# Patient Record
Sex: Male | Born: 1985
Health system: Southern US, Community
[De-identification: ages and names within clinical notes are randomized; demographics above are authoritative.]

## PROBLEM LIST (undated history)

## (undated) DIAGNOSIS — IMO0001 Reserved for inherently not codable concepts without codable children: Secondary | ICD-10-CM

## (undated) DIAGNOSIS — Z9109 Other allergy status, other than to drugs and biological substances: Secondary | ICD-10-CM

## (undated) DIAGNOSIS — K219 Gastro-esophageal reflux disease without esophagitis: Secondary | ICD-10-CM

## (undated) HISTORY — PX: WISDOM TOOTH EXTRACTION: SHX21

## (undated) HISTORY — PX: TONSILLECTOMY: SUR1361

---

## 2013-02-03 ENCOUNTER — Encounter (HOSPITAL_BASED_OUTPATIENT_CLINIC_OR_DEPARTMENT_OTHER): Payer: Self-pay | Admitting: *Deleted

## 2013-02-03 ENCOUNTER — Emergency Department (HOSPITAL_BASED_OUTPATIENT_CLINIC_OR_DEPARTMENT_OTHER)
Admission: EM | Admit: 2013-02-03 | Discharge: 2013-02-03 | Disposition: A | Discharge status: Home / Self Care | Payer: Self-pay | Attending: Emergency Medicine | Admitting: Emergency Medicine

## 2013-02-03 DIAGNOSIS — J029 Acute pharyngitis, unspecified: Secondary | ICD-10-CM | POA: Insufficient documentation

## 2013-02-03 DIAGNOSIS — Z79899 Other long term (current) drug therapy: Secondary | ICD-10-CM | POA: Insufficient documentation

## 2013-02-03 DIAGNOSIS — R51 Headache: Secondary | ICD-10-CM | POA: Insufficient documentation

## 2013-02-03 DIAGNOSIS — Z791 Long term (current) use of non-steroidal anti-inflammatories (NSAID): Secondary | ICD-10-CM | POA: Insufficient documentation

## 2013-02-03 DIAGNOSIS — R1310 Dysphagia, unspecified: Secondary | ICD-10-CM | POA: Insufficient documentation

## 2013-02-03 DIAGNOSIS — K219 Gastro-esophageal reflux disease without esophagitis: Secondary | ICD-10-CM | POA: Insufficient documentation

## 2013-02-03 DIAGNOSIS — R599 Enlarged lymph nodes, unspecified: Secondary | ICD-10-CM | POA: Insufficient documentation

## 2013-02-03 HISTORY — DX: Reserved for inherently not codable concepts without codable children: IMO0001

## 2013-02-03 HISTORY — DX: Gastro-esophageal reflux disease without esophagitis: K21.9

## 2013-02-03 MED ORDER — SALINE SPRAY 0.65 % NA SOLN: 1.0000 | NASAL | Status: DC | PRN

## 2013-02-03 MED ORDER — AMOXICILLIN-POT CLAVULANATE 875-125 MG PO TABS
1.0000 | ORAL_TABLET | Freq: Once | ORAL | Status: AC
  Administered 2013-02-03: 1 via ORAL
  Filled 2013-02-03: qty 1

## 2013-02-03 MED ORDER — DEXAMETHASONE 1 MG/ML PO CONC
10.0000 mg | Freq: Once | ORAL | Status: AC
  Administered 2013-02-03: 10 mg via ORAL
  Filled 2013-02-03: qty 1

## 2013-02-03 MED ORDER — LIDOCAINE VISCOUS 2 % MT SOLN
15.0000 mL | Freq: Once | OROMUCOSAL | Status: AC
  Administered 2013-02-03: 15 mL via OROMUCOSAL
  Filled 2013-02-03: qty 15

## 2013-02-03 MED ORDER — AMOXICILLIN-POT CLAVULANATE 875-125 MG PO TABS: 1.0000 | ORAL_TABLET | Freq: Two times a day (BID) | ORAL | Status: DC

## 2013-02-03 NOTE — ED Notes (Signed)
Patient states he has a five day history of a sore throat.  States he is having problems swallowing, sore throat, headache, generalized body aches and lymph node swelling.  Throat is red with swollen tonsils with white exudate.

## 2013-02-03 NOTE — ED Provider Notes (Signed)
CSN: [id number]     Arrival date & time 02/03/13  4098 History   First MD Initiated Contact with Patient 02/03/13 1023     Chief Complaint  Patient presents with  . Sore Throat   (Consider location/radiation/quality/duration/timing/severity/associated sxs/prior Treatment) HPI Patient presents with concern of sore throat.  Symptoms began approximately 5 days ago.  Since onset he has had sore throat, headache, full sensation in both ears.  There is difficulty swallowing.  There is no dyspnea, chest pain, nausea, vomiting. No behavioral changes, no confusion, no disorientation. Patient states that he has a history of tonsillitis. No relief w otc meds, no clear exacerbating factors.   Past Medical History  Diagnosis Date  . Reflux    History reviewed. No pertinent past surgical history. No family history on file. History  Substance Use Topics  . Smoking status: Never Smoker   . Smokeless tobacco: Not on file  . Alcohol Use: Yes     Comment: occassionally    Review of Systems  All other systems reviewed and are negative.    Allergies  Review of patient's allergies indicates no known allergies.  Home Medications   Current Outpatient Rx  Name  Route  Sig  Dispense  Refill  . calcium carbonate (TUMS - DOSED IN MG ELEMENTAL CALCIUM) 500 MG chewable tablet   Oral   Chew 1 tablet by mouth daily.         . naproxen sodium (ANAPROX) 220 MG tablet   Oral   Take 220 mg by mouth 2 (two) times daily with a meal.          BP 134/66  Pulse 65  Temp(Src) 98.1 F (36.7 C) (Oral)  Resp 20  Ht 6\' 1"  (1.854 m)  Wt 223 lb (101.152 kg)  BMI 29.43 kg/m2  SpO2 100% Physical Exam  Nursing note and vitals reviewed. Constitutional: He is oriented to person, place, and time. He appears well-developed. No distress.  HENT:  Head: Normocephalic and atraumatic.  Each ear has no injection, but there is mild fluid behind the tympanic membranes. The posterior oropharynx is symmetric, and  an edematous uvula.  There is mild bilateral tonsillar enhancement, no gross discharge.  Eyes: Conjunctivae and EOM are normal.  Cardiovascular: Normal rate and regular rhythm.   Pulmonary/Chest: Effort normal. No stridor. No respiratory distress.  Abdominal: He exhibits no distension.  Musculoskeletal: He exhibits no edema.  Lymphadenopathy:    He has cervical adenopathy.       Right cervical: Superficial cervical adenopathy present.       Left cervical: Superficial cervical adenopathy present.  Neurological: He is alert and oriented to person, place, and time.  Skin: Skin is warm and dry.  Psychiatric: He has a normal mood and affect.    ED Course  Procedures (including critical care time) Labs Review Labs Reviewed  RAPID STREP SCREEN   Imaging Review No results found. Pulse oximetry 99% room air normal  MDM  No diagnosis found. Patient with history of tonsillitis presents with new sore throat.  On exam he is awake and alert, with no evidence of distress, or any fever.  Patient's posterior oral pharynx is mildly enhanced, without gross exudate.  However, with his history of tonsillitis, his description of difficulty swallowing, he will be started on antibiotics, pending throat culture.    Gerhard Munch, MD 02/03/13 1105

## 2013-02-06 LAB — CULTURE, GROUP A STREP

## 2013-04-29 ENCOUNTER — Emergency Department (HOSPITAL_BASED_OUTPATIENT_CLINIC_OR_DEPARTMENT_OTHER)
Admission: EM | Admit: 2013-04-29 | Discharge: 2013-04-29 | Disposition: A | Discharge status: Home / Self Care | Payer: Self-pay | Attending: Emergency Medicine | Admitting: Emergency Medicine

## 2013-04-29 ENCOUNTER — Encounter (HOSPITAL_BASED_OUTPATIENT_CLINIC_OR_DEPARTMENT_OTHER): Payer: Self-pay | Admitting: Emergency Medicine

## 2013-04-29 DIAGNOSIS — Z8709 Personal history of other diseases of the respiratory system: Secondary | ICD-10-CM | POA: Insufficient documentation

## 2013-04-29 DIAGNOSIS — H01001 Unspecified blepharitis right upper eyelid: Secondary | ICD-10-CM

## 2013-04-29 DIAGNOSIS — Z791 Long term (current) use of non-steroidal anti-inflammatories (NSAID): Secondary | ICD-10-CM | POA: Insufficient documentation

## 2013-04-29 DIAGNOSIS — K219 Gastro-esophageal reflux disease without esophagitis: Secondary | ICD-10-CM | POA: Insufficient documentation

## 2013-04-29 DIAGNOSIS — H01009 Unspecified blepharitis unspecified eye, unspecified eyelid: Secondary | ICD-10-CM | POA: Insufficient documentation

## 2013-04-29 DIAGNOSIS — Z79899 Other long term (current) drug therapy: Secondary | ICD-10-CM | POA: Insufficient documentation

## 2013-04-29 HISTORY — DX: Other allergy status, other than to drugs and biological substances: Z91.09

## 2013-04-29 MED ORDER — ERYTHROMYCIN 5 MG/GM OP OINT: 1.0000 "application " | TOPICAL_OINTMENT | Freq: Every day | OPHTHALMIC | Status: DC

## 2013-04-29 MED ORDER — TETRACAINE HCL 0.5 % OP SOLN
1.0000 [drp] | Freq: Once | OPHTHALMIC | Status: AC
  Administered 2013-04-29: 1 [drp] via OPHTHALMIC
  Filled 2013-04-29: qty 2

## 2013-04-29 MED ORDER — FLUORESCEIN SODIUM 1 MG OP STRP
2.0000 | ORAL_STRIP | Freq: Once | OPHTHALMIC | Status: DC
  Filled 2013-04-29: qty 2

## 2013-04-29 NOTE — ED Provider Notes (Signed)
I saw and evaluated the patient, reviewed the resident's note and I agree with the findings and plan.   .Face to face Exam:  General:  Awake HEENT:  Atraumatic Resp:  Normal effort Abd:  Nondistended Neuro:No focal weakness Lymph: No adenopathy   Nelia Shi, MD 04/29/13 [phone]

## 2013-04-29 NOTE — ED Provider Notes (Signed)
CSN: [id number]     Arrival date & time 04/29/13  4098 History   First MD Initiated Contact with Patient 04/29/13 [phone]     Chief Complaint  Patient presents with  . Eye Pain   (Consider location/radiation/quality/duration/timing/severity/associated sxs/prior Treatment) Patient is a 27 y.o. male presenting with eye pain.  Eye Pain Pertinent negatives include no abdominal pain, chest pain, congestion, fatigue, fever or sore throat.    Tommy Bradley is a 27 y.o. man who presents with a cc of right sided eyelid pain. 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 He was in his normal state of health until 2 days ago when he noticed right sided eye lid discomfort. This progressed to a swollen right eyelid and increased discomfort.He also notes eyelid discomfort of his left eyelid c.b minimal swelling. He notes a dry itchy sensation of both eyes. He has not had this problem before. He denies diplopia, photophobia, pain with eye movements, and blurry vision. Denies other constitutional symptoms.   Past Medical History  Diagnosis Date  . Reflux   . Pollen allergies    Past Surgical History  Procedure Laterality Date  . Wisdom tooth extraction     No family history on file. History  Substance Use Topics  . Smoking status: Never Smoker   . Smokeless tobacco: Never Used  . Alcohol Use: 0.6 oz/week    1 Cans of beer per week     Comment: occassionally    Review of Systems  Constitutional: Negative for fever and fatigue.  HENT: Negative for congestion, rhinorrhea, sneezing and sore throat.   Eyes: Positive for pain, redness and itching. Negative for photophobia, discharge and visual disturbance.  Respiratory: Negative for shortness of breath.   Cardiovascular: Negative for chest pain and leg swelling.  Gastrointestinal: Negative for abdominal pain and abdominal distention.    Allergies  Review of patient's allergies indicates no known allergies.  Home  Medications   Current Outpatient Rx  Name  Route  Sig  Dispense  Refill  . calcium carbonate (TUMS - DOSED IN MG ELEMENTAL CALCIUM) 500 MG chewable tablet   Oral   Chew 1 tablet by mouth daily.         . naproxen sodium (ANAPROX) 220 MG tablet   Oral   Take 220 mg by mouth 2 (two) times daily with a meal.         . amoxicillin-clavulanate (AUGMENTIN) 875-125 MG per tablet   Oral   Take 1 tablet by mouth every 12 (twelve) hours.   14 tablet   0   . sodium chloride (OCEAN) 0.65 % SOLN nasal spray   Nasal   Place 1 spray into the nose as needed (sinus congestion and ear fullness).   1 Bottle   0    BP 122/67  Pulse 54  Temp(Src) 98 F (36.7 C) (Oral)  Ht 6\' 1"  (1.854 m)  Wt 225 lb (102.059 kg)  BMI 29.69 kg/m2  SpO2 100% Physical Exam  Constitutional: He appears well-developed and well-nourished. No distress.  HENT:  Head: Normocephalic.  Mouth/Throat: Oropharynx is clear and moist. No oropharyngeal exudate.  Eyes: EOM are normal. Pupils are equal, round, and reactive to light. Right eye exhibits no discharge. Left eye exhibits no discharge. No scleral icterus.  Slit lamp exam:      The right eye shows no corneal abrasion, no corneal flare, no corneal ulcer, no foreign body, no hyphema, no hypopyon and  no fluorescein uptake.       The left eye shows no corneal abrasion, no corneal flare, no corneal ulcer, no foreign body, no hyphema, no hypopyon and no fluorescein uptake.    Edema of the right upper eyelid with a small area of focused swelling nasally. Three small (<1 mm) hazy opacities temporally in the right cornea. One small area of the left cornea with hazy opacity temporally. No fluorescein uptake in either cornea. Pressure 14 OD and 13 OS. VA 20/30 bil with spectacles. Moderate erythema of the right conjunctiva and mild erythema of the left conjunctiva. Tear film decreased bilaterally.  Skin: He is not diaphoretic.    ED Course  Procedures (including critical  care time) Labs Review Labs Reviewed - No data to display Imaging Review No results found.  EKG Interpretation   None       MDM   1. Blepharitis of right upper eyelid     1. Blepharitis The patients eye discomfort is likely secondary to blepharitis. Orbital cellulitis is unlikely given normal pressure normal visual fields and normal EOM. I recommended the patient to avoid CTLs until see eye doctor. I recommended the patient to contact an ophthalmolost on Monday for an appointment early in the week. I also recommended the patient to perform eye hygiene with baby shampoo on cotton swabs and erythromycin ointment. The patient is in agreement with this plan.     Pleas Koch, MD 04/29/13 [phone]

## 2013-04-29 NOTE — ED Notes (Signed)
C/o irritation to rim of right eye lid since Thursday- redness noted- states now left eye hurting also

## 2013-07-12 ENCOUNTER — Emergency Department (HOSPITAL_BASED_OUTPATIENT_CLINIC_OR_DEPARTMENT_OTHER)
Admission: EM | Admit: 2013-07-12 | Discharge: 2013-07-12 | Disposition: A | Discharge status: Home / Self Care | Payer: BC Managed Care – PPO | Attending: Emergency Medicine | Admitting: Emergency Medicine

## 2013-07-12 ENCOUNTER — Encounter (HOSPITAL_BASED_OUTPATIENT_CLINIC_OR_DEPARTMENT_OTHER): Payer: Self-pay | Admitting: Emergency Medicine

## 2013-07-12 ENCOUNTER — Emergency Department (HOSPITAL_BASED_OUTPATIENT_CLINIC_OR_DEPARTMENT_OTHER): Payer: BC Managed Care – PPO

## 2013-07-12 DIAGNOSIS — R630 Anorexia: Secondary | ICD-10-CM | POA: Insufficient documentation

## 2013-07-12 DIAGNOSIS — R51 Headache: Secondary | ICD-10-CM | POA: Insufficient documentation

## 2013-07-12 DIAGNOSIS — K219 Gastro-esophageal reflux disease without esophagitis: Secondary | ICD-10-CM | POA: Insufficient documentation

## 2013-07-12 DIAGNOSIS — Z791 Long term (current) use of non-steroidal anti-inflammatories (NSAID): Secondary | ICD-10-CM | POA: Insufficient documentation

## 2013-07-12 DIAGNOSIS — R509 Fever, unspecified: Secondary | ICD-10-CM | POA: Insufficient documentation

## 2013-07-12 DIAGNOSIS — Z792 Long term (current) use of antibiotics: Secondary | ICD-10-CM | POA: Insufficient documentation

## 2013-07-12 DIAGNOSIS — R5381 Other malaise: Secondary | ICD-10-CM | POA: Insufficient documentation

## 2013-07-12 DIAGNOSIS — R5383 Other fatigue: Secondary | ICD-10-CM

## 2013-07-12 DIAGNOSIS — R197 Diarrhea, unspecified: Secondary | ICD-10-CM | POA: Insufficient documentation

## 2013-07-12 DIAGNOSIS — R112 Nausea with vomiting, unspecified: Secondary | ICD-10-CM | POA: Insufficient documentation

## 2013-07-12 DIAGNOSIS — Z79899 Other long term (current) drug therapy: Secondary | ICD-10-CM | POA: Insufficient documentation

## 2013-07-12 DIAGNOSIS — R109 Unspecified abdominal pain: Secondary | ICD-10-CM | POA: Insufficient documentation

## 2013-07-12 LAB — COMPREHENSIVE METABOLIC PANEL
ALBUMIN: 3.8 g/dL (ref 3.5–5.2)
ALK PHOS: 77 U/L (ref 39–117)
ALT: 15 U/L (ref 0–53)
AST: 18 U/L (ref 0–37)
BILIRUBIN TOTAL: 0.6 mg/dL (ref 0.3–1.2)
BUN: 11 mg/dL (ref 6–23)
CHLORIDE: 99 meq/L (ref 96–112)
CO2: 28 mEq/L (ref 19–32)
CREATININE: 1.2 mg/dL (ref 0.50–1.35)
Calcium: 8.7 mg/dL (ref 8.4–10.5)
GFR calc Af Amer: >90 mL/min (ref >90)
GFR calc non Af Amer: 83 mL/min — ABNORMAL LOW (ref >90)
Glucose, Bld: 112 mg/dL — ABNORMAL HIGH (ref 70–99)
POTASSIUM: 3.3 meq/L — AB (ref 3.7–5.3)
SODIUM: 139 meq/L (ref 137–147)
Total Protein: 7.4 g/dL (ref 6.0–8.3)

## 2013-07-12 LAB — CBC WITH DIFFERENTIAL/PLATELET
BASOS ABS: 0 10*3/uL (ref 0.0–0.1)
BASOS PCT: 0 % (ref 0–1)
Eosinophils Absolute: 0 10*3/uL (ref 0.0–0.7)
Eosinophils Relative: 0 % (ref 0–5)
HCT: 45.4 % (ref 39.0–52.0)
Hemoglobin: 15.8 g/dL (ref 13.0–17.0)
Lymphocytes Relative: 13 % (ref 12–46)
Lymphs Abs: 1 10*3/uL (ref 0.7–4.0)
MCH: 29.2 pg (ref 26.0–34.0)
MCHC: 34.8 g/dL (ref 30.0–36.0)
MCV: 83.9 fL (ref 78.0–100.0)
Monocytes Absolute: 0.7 10*3/uL (ref 0.1–1.0)
Monocytes Relative: 9 % (ref 3–12)
NEUTROS ABS: 5.9 10*3/uL (ref 1.7–7.7)
NEUTROS PCT: 77 % (ref 43–77)
PLATELETS: 190 10*3/uL (ref 150–400)
RBC: 5.41 MIL/uL (ref 4.22–5.81)
RDW: 12.1 % (ref 11.5–15.5)
WBC: 7.6 10*3/uL (ref 4.0–10.5)

## 2013-07-12 LAB — URINALYSIS, ROUTINE W REFLEX MICROSCOPIC
Bilirubin Urine: NEGATIVE
GLUCOSE, UA: NEGATIVE mg/dL
Ketones, ur: NEGATIVE mg/dL
LEUKOCYTES UA: NEGATIVE
NITRITE: NEGATIVE
PH: 6 (ref 5.0–8.0)
Protein, ur: NEGATIVE mg/dL
SPECIFIC GRAVITY, URINE: 1.016 (ref 1.005–1.030)
Urobilinogen, UA: 0.2 mg/dL (ref 0.0–1.0)

## 2013-07-12 LAB — URINE MICROSCOPIC-ADD ON

## 2013-07-12 LAB — LIPASE, BLOOD: Lipase: 26 U/L (ref 11–59)

## 2013-07-12 MED ORDER — IOHEXOL 300 MG/ML  SOLN
100.0000 mL | Freq: Once | INTRAMUSCULAR | Status: AC | PRN
  Administered 2013-07-12: 100 mL via INTRAVENOUS

## 2013-07-12 MED ORDER — IOHEXOL 300 MG/ML  SOLN
50.0000 mL | Freq: Once | INTRAMUSCULAR | Status: AC | PRN
  Administered 2013-07-12: 50 mL via ORAL

## 2013-07-12 MED ORDER — ONDANSETRON HCL 4 MG/2ML IJ SOLN
4.0000 mg | Freq: Once | INTRAMUSCULAR | Status: AC
  Administered 2013-07-12: 4 mg via INTRAVENOUS
  Filled 2013-07-12: qty 2

## 2013-07-12 MED ORDER — ONDANSETRON HCL 4 MG PO TABS: 4.0000 mg | ORAL_TABLET | Freq: Four times a day (QID) | ORAL | Status: DC

## 2013-07-12 MED ORDER — MORPHINE SULFATE 4 MG/ML IJ SOLN
4.0000 mg | Freq: Once | INTRAMUSCULAR | Status: AC
Start: 2013-07-12 — End: 2013-07-12
  Administered 2013-07-12: 4 mg via INTRAVENOUS
  Filled 2013-07-12: qty 1

## 2013-07-12 MED ORDER — ACETAMINOPHEN 325 MG PO TABS
650.0000 mg | ORAL_TABLET | Freq: Once | ORAL | Status: AC
  Administered 2013-07-12: 650 mg via ORAL
  Filled 2013-07-12: qty 2

## 2013-07-12 MED ORDER — SODIUM CHLORIDE 0.9 % IV BOLUS (SEPSIS)
1000.0000 mL | Freq: Once | INTRAVENOUS | Status: AC
  Administered 2013-07-12: 1000 mL via INTRAVENOUS

## 2013-07-12 NOTE — Discharge Instructions (Signed)
Take Zofran as needed for nausea and vomiting  Drink plenty of fluids and rest  Eat a clear liquid diet for 24 hours  Take 600-800 mg Ibuprofen every 6-8 hours for fever/pain - you may alternate with 650 mg Tylenol every 4-6 hours  Return to the emergency department if you develop any changing/worsening condition, repeated vomiting, fever not reducing, blood in your stool/vomit, stiff neck, feeling like you are going to pass out, or any other concerns (please read additional information regarding your condition below)   Abdominal Pain, Adult Many things can cause abdominal pain. Usually, abdominal pain is not caused by a disease and will improve without treatment. It can often be observed and treated at home. Your health care provider will do a physical exam and possibly order blood tests and X-rays to help determine the seriousness of your pain. However, in many cases, more time must pass before a clear cause of the pain can be found. Before that point, your health care provider may not know if you need more testing or further treatment. HOME CARE INSTRUCTIONS  Monitor your abdominal pain for any changes. The following actions may help to alleviate any discomfort you are experiencing:  Only take over-the-counter or prescription medicines as directed by your health care provider.  Do not take laxatives unless directed to do so by your health care provider.  Try a clear liquid diet (broth, tea, or water) as directed by your health care provider. Slowly move to a bland diet as tolerated. SEEK MEDICAL CARE IF:  You have unexplained abdominal pain.  You have abdominal pain associated with nausea or diarrhea.  You have pain when you urinate or have a bowel movement.  You experience abdominal pain that wakes you in the night.  You have abdominal pain that is worsened or improved by eating food.  You have abdominal pain that is worsened with eating fatty foods. SEEK IMMEDIATE MEDICAL CARE IF:     Your pain does not go away within 2 hours.  You have a fever.  You keep throwing up (vomiting).  Your pain is felt only in portions of the abdomen, such as the right side or the left lower portion of the abdomen.  You pass bloody or black tarry stools. MAKE SURE YOU:  Understand these instructions.   Will watch your condition.   Will get help right away if you are not doing well or get worse.  Document Released: 02/25/2005 Document Revised: 03/08/2013 Document Reviewed: 01/25/2013 [facility] Patient Information 2014 [place].  Diarrhea Diarrhea is frequent loose and watery bowel movements. It can cause you to feel weak and dehydrated. Dehydration can cause you to become tired and thirsty, have a dry mouth, and have decreased urination that often is dark yellow. Diarrhea is a sign of another problem, most often an infection that will not last long. In most cases, diarrhea typically lasts 2 3 days. However, it can last longer if it is a sign of something more serious. It is important to treat your diarrhea as directed by your caregive to lessen or prevent future episodes of diarrhea. CAUSES  Some common causes include:  Gastrointestinal infections caused by viruses, bacteria, or parasites.  Food poisoning or food allergies.  Certain medicines, such as antibiotics, chemotherapy, and laxatives.  Artificial sweeteners and fructose.  Digestive disorders. HOME CARE INSTRUCTIONS  Ensure adequate fluid intake (hydration): have 1 cup (8 oz) of fluid for each diarrhea episode. Avoid fluids that contain simple sugars or sports drinks,  fruit juices, whole milk products, and sodas. Your urine should be clear or pale yellow if you are drinking enough fluids. Hydrate with an oral rehydration solution that you can purchase at pharmacies, retail stores, and online. You can prepare an oral rehydration solution at home by mixing the following ingredients together:    tsp table salt.    tsp baking soda.   tsp salt substitute containing potassium chloride.  1  tablespoons sugar.  1 L (34 oz) of water.  Certain foods and beverages may increase the speed at which food moves through the gastrointestinal (GI) tract. These foods and beverages should be avoided and include:  Caffeinated and alcoholic beverages.  High-fiber foods, such as raw fruits and vegetables, nuts, seeds, and whole grain breads and cereals.  Foods and beverages sweetened with sugar alcohols, such as xylitol, sorbitol, and mannitol.  Some foods may be well tolerated and may help thicken stool including:  Starchy foods, such as rice, toast, pasta, low-sugar cereal, oatmeal, grits, baked potatoes, crackers, and bagels.  Bananas.  Applesauce.  Add probiotic-rich foods to help increase healthy bacteria in the GI tract, such as yogurt and fermented milk products.  Wash your hands well after each diarrhea episode.  Only take over-the-counter or prescription medicines as directed by your caregiver.  Take a warm bath to relieve any burning or pain from frequent diarrhea episodes. SEEK IMMEDIATE MEDICAL CARE IF:   You are unable to keep fluids down.  You have persistent vomiting.  You have blood in your stool, or your stools are black and tarry.  You do not urinate in 6 8 hours, or there is only a small amount of very dark urine.  You have abdominal pain that increases or localizes.  You have weakness, dizziness, confusion, or lightheadedness.  You have a severe headache.  Your diarrhea gets worse or does not get better.  You have a fever or persistent symptoms for more than 2 3 days.  You have a fever and your symptoms suddenly get worse. MAKE SURE YOU:   Understand these instructions.  Will watch your condition.  Will get help right away if you are not doing well or get worse. Document Released: 05/08/2002 Document Revised: 05/04/2012 Document Reviewed: 01/24/2012 [facility] Patient  Information 2014 [place], [place].  Fever, Adult A fever is a higher than normal body temperature. In an adult, an oral temperature around 98.6 F (37 C) is considered normal. A temperature of 100.4 F (38 C) or higher is generally considered a fever. Mild or moderate fevers generally have no long-term effects and often do not require treatment. Extreme fever (greater than or equal to 106 F or 41.1 C) can cause seizures. The sweating that may occur with repeated or prolonged fever may cause dehydration. Elderly people can develop confusion during a fever. A measured temperature can vary with:  Age.  Time of day.  Method of measurement (mouth, underarm, rectal, or ear). The fever is confirmed by taking a temperature with a thermometer. Temperatures can be taken different ways. Some methods are accurate and some are not.  An oral temperature is used most commonly. Electronic thermometers are fast and accurate.  An ear temperature will only be accurate if the thermometer is positioned as recommended by the manufacturer.  A rectal temperature is accurate and done for those adults who have a condition where an oral temperature cannot be taken.  An underarm (axillary) temperature is not accurate and not recommended. Fever is a symptom, not a  disease.  CAUSES   Infections commonly cause fever.  Some noninfectious causes for fever include:  Some arthritis conditions.  Some thyroid or adrenal gland conditions.  Some immune system conditions.  Some types of cancer.  A medicine reaction.  High doses of certain street drugs such as methamphetamine.  Dehydration.  Exposure to high outside or room temperatures.  Occasionally, the source of a fever cannot be determined. This is sometimes called a "fever of unknown origin" (FUO).  Some situations may lead to a temporary rise in body temperature that may go away on its own. Examples are:  Childbirth.  Surgery.  Intense  exercise. HOME CARE INSTRUCTIONS   Take appropriate medicines for fever. Follow dosing instructions carefully. If you use acetaminophen to reduce the fever, be careful to avoid taking other medicines that also contain acetaminophen. Do not take aspirin for a fever if you are younger than age 69. There is an association with Reye's syndrome. Reye's syndrome is a rare but potentially deadly disease.  If an infection is present and antibiotics have been prescribed, take them as directed. Finish them even if you start to feel better.  Rest as needed.  Maintain an adequate fluid intake. To prevent dehydration during an illness with prolonged or recurrent fever, you may need to drink extra fluid.Drink enough fluids to keep your urine clear or pale yellow.  Sponging or bathing with room temperature water may help reduce body temperature. Do not use ice water or alcohol sponge baths.  Dress comfortably, but do not over-bundle. SEEK MEDICAL CARE IF:   You are unable to keep fluids down.  You develop vomiting or diarrhea.  You are not feeling at least partly better after 3 days.  You develop new symptoms or problems. SEEK IMMEDIATE MEDICAL CARE IF:   You have shortness of breath or trouble breathing.  You develop excessive weakness.  You are dizzy or you faint.  You are extremely thirsty or you are making little or no urine.  You develop new pain that was not there before (such as in the head, neck, chest, back, or abdomen).  You have persistant vomiting and diarrhea for more than 1 to 2 days.  You develop a stiff neck or your eyes become sensitive to light.  You develop a skin rash.  You have a fever or persistent symptoms for more than 2 to 3 days.  You have a fever and your symptoms suddenly get worse. MAKE SURE YOU:   Understand these instructions.  Will watch your condition.  Will get help right away if you are not doing well or get worse. Document Released: 11/11/2000  Document Revised: 08/10/2011 Document Reviewed: 03/19/2011 [facility] Patient Information 2014 [place], [place].  Diet The clear liquid diet consists of foods that are liquid or will become liquid at room temperature. Examples of foods allowed on a clear liquid diet include fruit juice, broth or bouillon, gelatin, or frozen ice pops. You should be able to see through the liquid. The purpose of this diet is to provide the necessary fluids, electrolytes (such as sodium and potassium), and energy to keep the body functioning during times when you are not able to consume a regular diet. A clear liquid diet should not be continued for long periods of time, as it is not nutritionally adequate.  A CLEAR LIQUID DIET MAY BE NEEDED:  When a sudden-onset (acute) condition occurs before or after surgery.   As the first step in oral feeding.   For fluid  and electrolyte replacement in diarrheal diseases.   As a diet before certain medical tests are performed.  ADEQUACY The clear liquid diet is adequate only in ascorbic acid, according to the Recommended Dietary Allowances of the [employer].  CHOOSING FOODS Breads and Starches  Allowed: None are allowed.   Avoid: All are to be avoided.  Vegetables  Allowed: Strained vegetable juices.   Avoid: Any others.  Fruit  Allowed: Strained fruit juices and fruit drinks. Include 1 serving of citrus or vitamin C-enriched fruit juice daily.   Avoid: Any others.  Meat and Meat Substitutes  Allowed: None are allowed.   Avoid: All are to be avoided.  Milk Products  Allowed: None are allowed.   Avoid: All are to be avoided.  Soups and Combination Foods  Allowed: Clear bouillon, broth, or strained broth-based soups.   Avoid: Any others.  Desserts and Sweets  Allowed: Sugar, honey. High-protein gelatin. Flavored gelatin, ices, or frozen ice pops that do not contain milk.   Avoid: Any others.   Fats and Oils  Allowed: None are allowed.   Avoid: All are to be avoided.  Beverages  Allowed: Cereal beverages, coffee (regular or decaffeinated), tea, or soda at the discretion of your health care provider.   Avoid: Any others.  Condiments  Allowed: Salt.   Avoid: Any others, including pepper.  Supplements  Allowed: Liquid nutrition beverages that you can see through.   Avoid: Any others that contain lactose or fiber. SAMPLE MEAL PLAN Breakfast  4 oz (120 mL) strained orange juice.   to 1 cup (120 to 240 mL) gelatin (plain or fortified).  1 cup (240 mL) beverage (coffee or tea).  Sugar, if desired. Midmorning Snack   cup (120 mL) gelatin (plain or fortified). Lunch  1 cup (240 mL) broth or consomm.  4 oz (120 mL) strained grapefruit juice.   cup (120 mL) gelatin (plain or fortified).  1 cup (240 mL) beverage (coffee or tea).  Sugar, if desired. Midafternoon Snack   cup (120 mL) fruit ice.   cup (120 mL) strained fruit juice. Dinner  1 cup (240 mL) broth or consomm.   cup (120 mL) cranberry juice.   cup (120 mL) flavored gelatin (plain or fortified).  1 cup (240 mL) beverage (coffee or tea).  Sugar, if desired. Evening Snack  4 oz (120 mL) strained apple juice (vitamin C-fortified).   cup (120 mL) flavored gelatin (plain or fortified). MAKE SURE YOU:  Understand these instructions.  Will watch your child's condition.  Will get help right away if your child is not doing well or gets worse. Document Released: 05/18/2005 Document Revised: 01/18/2013 Document Reviewed: 10/18/2012 [facility] Patient Information 2014 [place], [place].  Emergency Department Resource Guide 1) Find a Doctor and Pay Out of Pocket Although you won't have to find out who is covered by your insurance plan, it is a good idea to ask around and get recommendations. You will then need to call the office and see if the doctor you have chosen will  accept you as a new patient and what types of options they offer for patients who are self-pay. Some doctors offer discounts or will set up payment plans for their patients who do not have insurance, but you will need to ask so you aren't surprised when you get to your appointment.  2) Contact Your Local Health Department Not all health departments have doctors that can see patients for sick visits, but many do, so it is  worth a call to see if yours does. If you don't know where your local health department is, you can check in your phone book. The CDC also has a tool to help you locate your state's health department, and many state websites also have listings of all of their local health departments.  3) Find a Walk-in Clinic If your illness is not likely to be very severe or complicated, you may want to try a walk in clinic. These are popping up all over the country in pharmacies, drugstores, and shopping centers. They're usually staffed by nurse practitioners or physician assistants that have been trained to treat common illnesses and complaints. They're usually fairly quick and inexpensive. However, if you have serious medical issues or chronic medical problems, these are probably not your best option.  No Primary Care Doctor: - Call Health Connect at  [phone] - they can help you locate a primary care doctor that  accepts your insurance, provides certain services, etc. - Physician Referral Service- [phone]  Chronic Pain Problems: Organization         Address  Phone   Notes  [facility]  [phone] Patients need to be referred by their primary care doctor.   Medication Assistance: Organization         Address  Phone   Notes  [facility] Medication [facility] [address] [place] [place], [place] [address] [phone] --Must be a resident of [facility] -- Must have NO insurance coverage whatsoever (no Medicaid/ Medicare, etc.) -- The pt.  MUST have a primary care doctor that directs their care regularly and follows them in the community   [facility]  [phone]   [employer]  [phone]    Agencies that provide inexpensive medical care: Organization         Address  Phone   Notes  [facility]  [phone]   [facility]    [phone]   [facility] [address] [place], [place] [address] [phone]   [facility] [address] [place], [place] [phone]   [facility]anned Parenthood    [phone]   [facility]    [phone]   Community Health and [facility]  [address], [place] Phone:  [phone], Fax:  [phone] Hours of Operation:  9 am - 6 pm, M-F.  Also accepts Medicaid/Medicare and self-pay.  [facility]  [address], [place] Phone: [phone], Fax: [phone]. Hours of Operation:  8:30 am - 5:30 pm, M-F.  Also accepts Medicaid and self-pay.  [facility] [place] [address] [place] Phone: [phone]   [facility] [address] Natasha Bence [place], [place] [phone], Ext. [phone] Mondays & Thursdays: 7-9 AM.  First 15 patients are seen on a first come, first serve basis.    Medicaid-accepting [facility] Providers:  Organization         Address  Phone   Notes  [facility] [address],  [phone] Also accepts self-pay patients.  [facility] [address] Laurell Josephs [place], [place]  [phone]   [facility] [address], [place] [phone]   [facility] [address], [place] [phone]   Renaye Rakers [address] [place], [place]  7, [place]   [phone] Only accepts [place] patients after they have their name applied to their card.   Self-Pay (no insurance) in  [facility]:  Organization         Address  Phone   Notes  Sickle Cell Patients, [facility] [address] [place], [place] [phone]   [facility] [place] [facility] [address] [place], [place] [phone]   [facility]  [address] [place] [address], [place] [phone]   [facility]/Dr. Osei-Bonsu  [address] [place] or [address], [place] [phone] Phone number for both [place] and [place] locations is the same.  Urgent Medical and [facility] [address], [place] [phone]   [facility] [address] [place] or [address] [phone]   [facility] [address], [place] [phone], phone; [phone], fax Sees patients 1st and 3rd Saturday of every month.  Must not qualify for public or private insurance (i.e. Medicaid, Medicare, [place] Health Choice, Veterans' Benefits)  Household income should be no more than 200% of the poverty level The clinic cannot treat you if you are pregnant or think you are pregnant  Sexually transmitted diseases are not treated at the clinic.    Dental Care: Organization         Address  Phone  Notes  [facility] Department of [facility] [address] [place], [place] [phone] Accepts children up to age 56 who are enrolled in [place] or [place] Health Choice; pregnant women with a Medicaid card; and children who have applied for Medicaid or [place] Health Choice, but were declined, whose parents can pay a reduced fee at time of service.  [facility] Department of [facility]  [address], [place] [phone] Accepts children up to age 56 who are enrolled in [place] or [place] Health Choice; pregnant women with a Medicaid card; and children who have applied for Medicaid or [place] Health Choice, but were declined, whose  parents can pay a reduced fee at time of service.  [facility]  [address] [place], [place] [phone] Patients are seen by appointment only. Walk-ins are not accepted. [facility] will see patients 75 years of age and older. Monday - Tuesday (8am-5pm) Most Wednesdays (8:30-5pm) $30 per visit, cash only  [facility] Adult Dental Access PROGRAM  [address], [facility] [phone] Patients are seen by appointment only. Walk-ins are not accepted. [facility] will see patients 20 years of age and older. One Wednesday Evening (Monthly: Volunteer Based).  $30 per visit, cash only  [employer]  [phone] for adults; Children under age 39, call Graduate Pediatric Dentistry at [phone]. Children aged 85-14, please call [phone] to request a pediatric application.  Dental services are provided in all areas of dental care including fillings, crowns and bridges, complete and partial dentures, implants, gum treatment, root canals, and extractions. Preventive care is also provided. Treatment is provided to both adults and children. Patients are selected via a lottery and there is often a waiting list.   [facility] [address] [place]  [phone] [email or web address]   [facility] [address] [place], [place] [phone], Ext. [phone] Second and Fourth Thursday of each  month, opens at 6:30 AM; Clinic ends at 9 AM.  Patients are seen on a first-come first-served basis, and a limited number are seen during each clinic.   [facility]  [address] Ether Griffins [place], [place] [phone]   Eligibility Requirements You must have lived in [place], [place], or [place] counties for at least the last three months.   You cannot be eligible for state or federal sponsored [place], including [employer], [place], or [employer].   You generally cannot be eligible for  healthcare insurance through your employer.    How to apply: Eligibility screenings are held every Tuesday and Wednesday afternoon from 1:00 pm until 4:00 pm. You do not need an appointment for the interview!  [facility] [address] [place], [place] [phone]   [facility] Health Department  [phone]   [facility] Health Department  [phone]   [facility]  [phone]    Behavioral Health Resources in the Community: Intensive Outpatient Programs Organization         Address  Phone  Notes  [facility] Services [address] [place], [place] [phone]   [facility] Outpatient [address], [place], [place] [phone]   [facility] [address] [place], [place]  [phone]   [facility] [address],  [place], [place] [phone] or [phone]   Substance Abuse Resources Organization         Address  Phone  Notes  [facility] Drug Services  [phone]   [facility]  [phone]   [facility]  [phone]   Floydene Flock  [phone]   Residential & Outpatient Substance Abuse Program  [phone]   Psychological Services Organization         Address  Phone  Notes  [facility] Behavioral Health  [phone]   [facility] Services  [phone]   [facility] [address] [place] [phone] or [phone]    Mobile Crisis Teams Organization         Address  Phone  Notes  [facility], Mobile Crisis Care Unit  [phone]   [facility]  [address] [place], [place] [phone]   Doristine Locks [address] [place] [phone]    Self-Help/Support Groups Organization         Address  Phone             Notes  [facility] [place] - variety of support groups  [phone]- [phone] Call for more information  Narcotics  Anonymous (NA), [facility] [address], [employer] [place]  2 meetings at this location   Statistician         Address  Phone  Notes  [facility] [address],    [place]  [phone]   [facility]  [address] [place] [address], [place], [place] [phone]   [facility] [address] [place], [place] [phone] Admissions: 8am-3pm M-F  [facility] [address],    [place], [place] [phone]   [facility] [address] [place] Manns [place], [place] [phone]   [facility] [address]  [place], [place] [phone]   [facility] Intensive Outpatient [address], [place], [place] [phone]   [facility] ([facility] [address]  [place],  [place] [phone] or [phone]   [facility] [address] [place], [place] [phone] Accepts Medicaid  [facility] [address]  [place] [phone] Substance Abuse/Addiction Treatment   [facility]         Address  Phone  Notes  [facility]  [phone]   Angie Fava, PhD [address] Ervin Knack [place], [place]   [phone] or [phone]   [facility]   [address] [place], [place] [phone]   [facility] [address] [place], [place] [phone] Insurance/Medicaid/sponsorship through [facility] [address]                                    [place], [place] [phone] Therapy/tele-psych/case  [facility] [address], [place] [phone]    Dr. Lolly Mustache  [phone]   [facility]  [facility] Dept[facility] 1) [address], [place] 2) [address] [place] 3)  [address], [place] [phone]  [phone]   [facility] Child Abuse Hotline [phone] or [phone] (After Hours)

## 2013-07-12 NOTE — ED Notes (Signed)
Urinal provided.

## 2013-07-12 NOTE — ED Notes (Signed)
C/o n/v/d, chills x 2 days-also c/o HA-steady gait into triage

## 2013-07-13 NOTE — ED Provider Notes (Signed)
CSN: [id number]     Arrival date & time 07/12/13  1834 History   First MD Initiated Contact with Patient 07/12/13 1910     Chief Complaint  Patient presents with  . Emesis    HPI  Tommy Bradley is a 28 y.o. male with a PMH of reflux and pollen allergies who presents to the ED for evaluation of emesis.  History was provided by the patient. Patient states that he developed gradually worsening abdominal pain over the past 2 days. His pain is located diffusely throughout his abdomen. Movement and walking makes his pain worse. Nothing relieves his pain. He describes his pain as a constant stabbing "hunger" pain. He tried OTC anti-nausea and anti-gas medication with no relief. No similar abdominal pain in the past. No previous abdominal surgeries. He also has had nausea, vomiting, and diarrhea, which started today. He had 2-3 episodes of emesis and 6-7 episodes of watery-brown today. No hematemesis or hematochezia. No known sick contacts or contacts with similar symptoms. He also developed a subjective fever and chills today. He also complains of a bilateral temporal headache today. No vision changes, weakness, stiff neck. No coughing, chest pain, SOB, rhinorrhea, congestion, sore throat, leg edema, dizziness, or lightheadedness. He also has had intermittent left scrotal pressure with urination but denies any testicular pain. He also has had a foul odor in his urine. No difficulty with urination, penile discharge/pain.    Past Medical History  Diagnosis Date  . Reflux   . Pollen allergies    Past Surgical History  Procedure Laterality Date  . Wisdom tooth extraction     No family history on file. History  Substance Use Topics  . Smoking status: Never Smoker   . Smokeless tobacco: Never Used  . Alcohol Use: 0.0 oz/week     Comment: occassionally    Review of Systems  Constitutional: Positive for fever, chills, appetite change and fatigue. Negative for diaphoresis and activity change.  HENT:  Negative for congestion, rhinorrhea and sore throat.   Eyes: Negative for photophobia and visual disturbance.  Respiratory: Negative for cough and shortness of breath.   Cardiovascular: Negative for chest pain and leg swelling.  Gastrointestinal: Positive for nausea, vomiting, abdominal pain and diarrhea. Negative for constipation, blood in stool, anal bleeding and rectal pain.  Genitourinary: Negative for dysuria, urgency, hematuria, discharge, penile swelling, scrotal swelling, difficulty urinating, penile pain and testicular pain.  Neurological: Positive for headaches. Negative for dizziness, syncope, weakness, light-headedness and numbness.    Allergies  Review of patient's allergies indicates no known allergies.  Home Medications   Current Outpatient Rx  Name  Route  Sig  Dispense  Refill  . amoxicillin-clavulanate (AUGMENTIN) 875-125 MG per tablet   Oral   Take 1 tablet by mouth every 12 (twelve) hours.   14 tablet   0   . calcium carbonate (TUMS - DOSED IN MG ELEMENTAL CALCIUM) 500 MG chewable tablet   Oral   Chew 1 tablet by mouth daily.         Marland Kitchen erythromycin ophthalmic ointment   Both Eyes   Place 1 application into both eyes at bedtime.   3.5 g   0   . naproxen sodium (ANAPROX) 220 MG tablet   Oral   Take 220 mg by mouth 2 (two) times daily with a meal.         . ondansetron (ZOFRAN) 4 MG tablet   Oral   Take 1 tablet (4 mg total) by mouth every  6 (six) hours.   8 tablet   0   . sodium chloride (OCEAN) 0.65 % SOLN nasal spray   Nasal   Place 1 spray into the nose as needed (sinus congestion and ear fullness).   1 Bottle   0    BP 113/58  Pulse 69  Temp(Src) 99 F (37.2 C) (Oral)  Resp 18  Ht 6\' 1"  (1.854 m)  Wt 215 lb (97.523 kg)  BMI 28.37 kg/m2  SpO2 99%  Filed Vitals:   07/12/13 1841 07/12/13 1925 07/12/13 2048  BP: 96/60 135/58 113/58  Pulse: 84 72 69  Temp: 100.3 F (37.9 C) 102.8 F (39.3 C) 99 F (37.2 C)  TempSrc: Oral Oral    Resp: 16 18   Height: 6\' 1"  (1.854 m)    Weight: 215 lb (97.523 kg)    SpO2: 100% 99%     Physical Exam  Nursing note and vitals reviewed. Constitutional: He is oriented to person, place, and time. He appears well-developed and well-nourished. No distress.  HENT:  Head: Normocephalic and atraumatic.  Right Ear: External ear normal.  Left Ear: External ear normal.  Nose: Nose normal.  Mouth/Throat: Oropharynx is clear and moist. No oropharyngeal exudate.  Eyes: Conjunctivae are normal. Right eye exhibits no discharge. Left eye exhibits no discharge.  Neck: Normal range of motion. Neck supple.  Cardiovascular: Normal rate, regular rhythm, normal heart sounds and intact distal pulses.  Exam reveals no gallop and no friction rub.   No murmur heard. Pulmonary/Chest: Effort normal and breath sounds normal. No respiratory distress. He has no wheezes. He has no rales. He exhibits no tenderness.  Abdominal: Soft. Bowel sounds are normal. He exhibits no distension and no mass. There is tenderness. There is no rebound and no guarding.  Diffuse tenderness to palpation to the abdomen throughout  Genitourinary:  No testicular tenderness bilaterally. No genital sores. No edema to the scrotum.   Musculoskeletal: Normal range of motion. He exhibits no edema and no tenderness.  No lumbar, CVA, or flank tenderness bilaterally  Neurological: He is alert and oriented to person, place, and time.  Skin: Skin is warm and dry. He is not diaphoretic.    ED Course  Procedures (including critical care time) Labs Review Labs Reviewed  URINALYSIS, ROUTINE W REFLEX MICROSCOPIC - Abnormal; Notable for the following:    Hgb urine dipstick SMALL (*)    All other components within normal limits  COMPREHENSIVE METABOLIC PANEL - Abnormal; Notable for the following:    Potassium 3.3 (*)    Glucose, Bld 112 (*)    GFR calc non Af Amer 83 (*)    All other components within normal limits  URINE MICROSCOPIC-ADD ON   CBC WITH DIFFERENTIAL  LIPASE, BLOOD   Imaging Review Ct Abdomen Pelvis W Contrast  07/12/2013   CLINICAL DATA:  Mid abdomen pain  EXAM: CT ABDOMEN AND PELVIS WITH CONTRAST  TECHNIQUE: Multidetector CT imaging of the abdomen and pelvis was performed using the standard protocol following bolus administration of intravenous contrast.  CONTRAST:  100mL OMNIPAQUE IOHEXOL 300 MG/ML  SOLN  COMPARISON:  None.  FINDINGS: The liver, spleen, pancreas, gallbladder, adrenal glands and kidneys are normal. There is a splenule posterior to the spleen. The aorta is normal. There is no abdominal lymphadenopathy. There is no small bowel obstruction or diverticulitis. The appendix is normal. Bowel content is noted throughout colon.  Fluid-filled bladder is normal. The prostate gland is normal. There is minimal dependent atelectasis of  the posterior lung bases without focal pneumonia or pleural effusion. No acute abnormalities identified within the visualized bones.  IMPRESSION: No acute abnormality identified in the abdomen and pelvis. There is no bowel obstruction. The appendix is normal.   Electronically Signed   By: Sherian Rein M.D.   On: 07/12/2013 22:39    EKG Interpretation   None       Results for orders placed during the hospital encounter of 07/12/13  URINALYSIS, ROUTINE W REFLEX MICROSCOPIC      Result Value Ref Range   Color, Urine YELLOW  YELLOW   APPearance CLEAR  CLEAR   Specific Gravity, Urine 1.016  1.005 - 1.030   pH 6.0  5.0 - 8.0   Glucose, UA NEGATIVE  NEGATIVE mg/dL   Hgb urine dipstick SMALL (*) NEGATIVE   Bilirubin Urine NEGATIVE  NEGATIVE   Ketones, ur NEGATIVE  NEGATIVE mg/dL   Protein, ur NEGATIVE  NEGATIVE mg/dL   Urobilinogen, UA 0.2  0.0 - 1.0 mg/dL   Nitrite NEGATIVE  NEGATIVE   Leukocytes, UA NEGATIVE  NEGATIVE  URINE MICROSCOPIC-ADD ON      Result Value Ref Range   Squamous Epithelial / LPF RARE  RARE   RBC / HPF 3-6  <3 RBC/hpf   Bacteria, UA RARE  RARE  CBC WITH  DIFFERENTIAL      Result Value Ref Range   WBC 7.6  4.0 - 10.5 K/uL   RBC 5.41  4.22 - 5.81 MIL/uL   Hemoglobin 15.8  13.0 - 17.0 g/dL   HCT 16.1  09.6 - 04.5 %   MCV 83.9  78.0 - 100.0 fL   MCH 29.2  26.0 - 34.0 pg   MCHC 34.8  30.0 - 36.0 g/dL   RDW 40.9  81.1 - 91.4 %   Platelets 190  150 - 400 K/uL   Neutrophils Relative % 77  43 - 77 %   Neutro Abs 5.9  1.7 - 7.7 K/uL   Lymphocytes Relative 13  12 - 46 %   Lymphs Abs 1.0  0.7 - 4.0 K/uL   Monocytes Relative 9  3 - 12 %   Monocytes Absolute 0.7  0.1 - 1.0 K/uL   Eosinophils Relative 0  0 - 5 %   Eosinophils Absolute 0.0  0.0 - 0.7 K/uL   Basophils Relative 0  0 - 1 %   Basophils Absolute 0.0  0.0 - 0.1 K/uL  COMPREHENSIVE METABOLIC PANEL      Result Value Ref Range   Sodium 139  137 - 147 mEq/L   Potassium 3.3 (*) 3.7 - 5.3 mEq/L   Chloride 99  96 - 112 mEq/L   CO2 28  19 - 32 mEq/L   Glucose, Bld 112 (*) 70 - 99 mg/dL   BUN 11  6 - 23 mg/dL   Creatinine, Ser 7.82  0.50 - 1.35 mg/dL   Calcium 8.7  8.4 - 95.6 mg/dL   Total Protein 7.4  6.0 - 8.3 g/dL   Albumin 3.8  3.5 - 5.2 g/dL   AST 18  0 - 37 U/L   ALT 15  0 - 53 U/L   Alkaline Phosphatase 77  39 - 117 U/L   Total Bilirubin 0.6  0.3 - 1.2 mg/dL   GFR calc non Af Amer 83 (*) >90 mL/min   GFR calc Af Amer >90  >90 mL/min  LIPASE, BLOOD      Result Value Ref Range   Lipase  26  11 - 59 U/L       CT Abdomen Pelvis W Contrast (Final result)  Result time: 07/12/13 22:39:53    Final result by Rad Results In Interface (07/12/13 22:39:53)    Narrative:   CLINICAL DATA: Mid abdomen pain  EXAM: CT ABDOMEN AND PELVIS WITH CONTRAST  TECHNIQUE: Multidetector CT imaging of the abdomen and pelvis was performed using the standard protocol following bolus administration of intravenous contrast.  CONTRAST: OMNIPAQUE IOHEXOL 300 MG/ML SOLN  COMPARISON: None.  FINDINGS: The liver, spleen, pancreas, gallbladder, adrenal glands and kidneys are normal. There is a  splenule posterior to the spleen. The aorta is normal. There is no abdominal lymphadenopathy. There is no small bowel obstruction or diverticulitis. The appendix is normal. Bowel content is noted throughout colon.  Fluid-filled bladder is normal. The prostate gland is normal. There is minimal dependent atelectasis of the posterior lung bases without focal pneumonia or pleural effusion. No acute abnormalities identified within the visualized bones.  IMPRESSION: No acute abnormality identified in the abdomen and pelvis. There is no bowel obstruction. The appendix is normal.   Electronically Signed By: Sherian Rein M.D. On: 07/12/2013 22:39    MDM   Tommy Bradley is a 28 y.o. male with a PMH of reflux and pollen allergies who presents to the ED for evaluation of emesis.  Rechecks  9:00 PM = Abdominal exam repeated revealed RLQ tenderness as well as diffuse tenderness. Ordering CT scan. Headache improved.  11:00 PM = Patient in no distress. States he feels better. Ready for discharge. Asking for something to eat.    Etiology of abdominal pain possibly due to gastroenteritis. Labs unremarkable. CT scan negative for an acute intraabdominal pathology. Pain improved throughout ED visit. No emesis. Patient had a fever which reduced with Tylenol. Return precautions, discharge instructions, and follow-up was discussed with the patient before discharge.     Discharge Medication List as of 07/12/2013 10:53 PM    START taking these medications   Details  ondansetron (ZOFRAN) 4 MG tablet Take 1 tablet (4 mg total) by mouth every 6 (six) hours., Starting 07/12/2013, Until Discontinued, Print        Final impressions: 1. Nausea vomiting and diarrhea   2. Abdominal pain   3. Fever      Greer Ee Yuna Pizzolato PA-C         Jillyn Ledger, PA-C 07/13/13 [phone]

## 2013-07-15 NOTE — ED Provider Notes (Signed)
Medical screening examination/treatment/procedure(s) were performed by non-physician practitioner and as supervising physician I was immediately available for consultation/collaboration.  EKG Interpretation   None        Martha K Linker, MD 07/15/13 0714 

## 2013-10-30 ENCOUNTER — Encounter (HOSPITAL_BASED_OUTPATIENT_CLINIC_OR_DEPARTMENT_OTHER): Payer: Self-pay | Admitting: Emergency Medicine

## 2013-10-30 ENCOUNTER — Emergency Department (HOSPITAL_BASED_OUTPATIENT_CLINIC_OR_DEPARTMENT_OTHER)
Admission: EM | Admit: 2013-10-30 | Discharge: 2013-10-30 | Disposition: A | Discharge status: Home / Self Care | Payer: BC Managed Care – PPO | Attending: Emergency Medicine | Admitting: Emergency Medicine

## 2013-10-30 DIAGNOSIS — W219XXA Striking against or struck by unspecified sports equipment, initial encounter: Secondary | ICD-10-CM | POA: Insufficient documentation

## 2013-10-30 DIAGNOSIS — Z791 Long term (current) use of non-steroidal anti-inflammatories (NSAID): Secondary | ICD-10-CM | POA: Insufficient documentation

## 2013-10-30 DIAGNOSIS — Z79899 Other long term (current) drug therapy: Secondary | ICD-10-CM | POA: Insufficient documentation

## 2013-10-30 DIAGNOSIS — Z792 Long term (current) use of antibiotics: Secondary | ICD-10-CM | POA: Insufficient documentation

## 2013-10-30 DIAGNOSIS — Y9239 Other specified sports and athletic area as the place of occurrence of the external cause: Secondary | ICD-10-CM | POA: Insufficient documentation

## 2013-10-30 DIAGNOSIS — Y9367 Activity, basketball: Secondary | ICD-10-CM | POA: Insufficient documentation

## 2013-10-30 DIAGNOSIS — Y92838 Other recreation area as the place of occurrence of the external cause: Secondary | ICD-10-CM

## 2013-10-30 DIAGNOSIS — K219 Gastro-esophageal reflux disease without esophagitis: Secondary | ICD-10-CM | POA: Insufficient documentation

## 2013-10-30 DIAGNOSIS — S79919A Unspecified injury of unspecified hip, initial encounter: Secondary | ICD-10-CM | POA: Insufficient documentation

## 2013-10-30 DIAGNOSIS — M79659 Pain in unspecified thigh: Secondary | ICD-10-CM

## 2013-10-30 DIAGNOSIS — S79929A Unspecified injury of unspecified thigh, initial encounter: Principal | ICD-10-CM

## 2013-10-30 MED ORDER — HYDROCODONE-ACETAMINOPHEN 5-325 MG PO TABS: 1.0000 | ORAL_TABLET | ORAL | Status: DC | PRN

## 2013-10-30 MED ORDER — IBUPROFEN 800 MG PO TABS: 800.0000 mg | ORAL_TABLET | Freq: Three times a day (TID) | ORAL | Status: DC

## 2013-10-30 NOTE — ED Provider Notes (Signed)
CSN: [id number]     Arrival date & time 10/30/13  1121 History   First MD Initiated Contact with Patient 10/30/13 1143     Chief Complaint  Patient presents with  . Leg Pain     (Consider location/radiation/quality/duration/timing/severity/associated sxs/prior Treatment) HPI Comments: Pt states about 1 month ago he collided with another player playing basketball and he has had pain in his left thigh since. Pt denies problems walking. Has taken aleve intermittently. States that has a bump on his thigh where the pain is.  The history is provided by the patient. No language interpreter was used.    Past Medical History  Diagnosis Date  . Reflux   . Pollen allergies    Past Surgical History  Procedure Laterality Date  . Wisdom tooth extraction     No family history on file. History  Substance Use Topics  . Smoking status: Never Smoker   . Smokeless tobacco: Never Used  . Alcohol Use: No    Review of Systems  Constitutional: Negative.   Respiratory: Negative.   Cardiovascular: Negative.       Allergies  Review of patient's allergies indicates no known allergies.  Home Medications   Prior to Admission medications   Medication Sig Start Date End Date Taking? Authorizing Provider  amoxicillin-clavulanate (AUGMENTIN) 875-125 MG per tablet Take 1 tablet by mouth every 12 (twelve) hours. 02/03/13   Gerhard Munch, MD  calcium carbonate (TUMS - DOSED IN MG ELEMENTAL CALCIUM) 500 MG chewable tablet Chew 1 tablet by mouth daily.    Historical Provider, MD  erythromycin ophthalmic ointment Place 1 application into both eyes at bedtime. 04/29/13   Pleas Koch, MD  naproxen sodium (ANAPROX) 220 MG tablet Take 220 mg by mouth 2 (two) times daily with a meal.    Historical Provider, MD  ondansetron (ZOFRAN) 4 MG tablet Take 1 tablet (4 mg total) by mouth every 6 (six) hours. 07/12/13   Jillyn Ledger, PA-C  sodium chloride (OCEAN) 0.65 % SOLN nasal spray Place 1 spray into  the nose as needed (sinus congestion and ear fullness). 02/03/13   Gerhard Munch, MD   BP 133/62  Pulse 64  Temp(Src) 98.1 F (36.7 C) (Oral)  Resp 16  Ht 6\' 1"  (1.854 m)  Wt 220 lb (99.791 kg)  BMI 29.03 kg/m2  SpO2 100% Physical Exam  Nursing note and vitals reviewed. Constitutional: He is oriented to person, place, and time.  Cardiovascular: Normal rate and regular rhythm.   Pulmonary/Chest: Effort normal and breath sounds normal.  Musculoskeletal: Normal range of motion.  Full rom in the left leg. No swelling or deformity noted. Tenderness with palpation mid thigh  Neurological: He is alert and oriented to person, place, and time. He exhibits normal muscle tone. Coordination normal.    ED Course  Procedures (including critical care time) Labs Review Labs Reviewed - No data to display  Imaging Review No results found.   EKG Interpretation None      MDM   Final diagnoses:  Thigh pain    Don't think imaging is needed at this time. Doubt bony abnormality. Likely muscle related   Teressa Lower, NP 10/30/13 1219

## 2013-10-30 NOTE — ED Notes (Signed)
approx 1 month ago-pt collided with another person playing ball-pain to anterior left thigh

## 2013-10-30 NOTE — Discharge Instructions (Signed)
Musculoskeletal Pain °Musculoskeletal pain is muscle and boney aches and pains. These pains can occur in any part of the body. Your caregiver may treat you without knowing the cause of the pain. They may treat you if blood or urine tests, X-rays, and other tests were normal.  °CAUSES °There is often not a definite cause or reason for these pains. These pains may be caused by a type of germ (virus). The discomfort may also come from overuse. Overuse includes working out too hard when your body is not fit. Boney aches also come from weather changes. Bone is sensitive to atmospheric pressure changes. °HOME CARE INSTRUCTIONS  °· Ask when your test results will be ready. Make sure you get your test results. °· Only take over-the-counter or prescription medicines for pain, discomfort, or fever as directed by your caregiver. If you were given medications for your condition, do not drive, operate machinery or power tools, or sign legal documents for 24 hours. Do not drink alcohol. Do not take sleeping pills or other medications that may interfere with treatment. °· Continue all activities unless the activities cause more pain. When the pain lessens, slowly resume normal activities. Gradually increase the intensity and duration of the activities or exercise. °· During periods of severe pain, bed rest may be helpful. Lay or sit in any position that is comfortable. °· Putting ice on the injured area. °· Put ice in a bag. °· Place a towel between your skin and the bag. °· Leave the ice on for 15 to 20 minutes, 3 to 4 times a day. °· Follow up with your caregiver for continued problems and no reason can be found for the pain. If the pain becomes worse or does not go away, it may be necessary to repeat tests or do additional testing. Your caregiver may need to look further for a possible cause. °SEEK IMMEDIATE MEDICAL CARE IF: °· You have pain that is getting worse and is not relieved by medications. °· You develop chest pain  that is associated with shortness or breath, sweating, feeling sick to your stomach (nauseous), or throw up (vomit). °· Your pain becomes localized to the abdomen. °· You develop any new symptoms that seem different or that concern you. °MAKE SURE YOU:  °· Understand these instructions. °· Will watch your condition. °· Will get help right away if you are not doing well or get worse. °Document Released: 05/18/2005 Document Revised: 08/10/2011 Document Reviewed: 01/20/2013 °Exit[employer]® Patient Information ©2014 [employer]. ° °

## 2013-11-02 NOTE — ED Provider Notes (Signed)
Medical screening examination/treatment/procedure(s) were performed by non-physician practitioner and as supervising physician I was immediately available for consultation/collaboration.   EKG Interpretation None        Candyce Churn III, MD 11/02/13 [phone]

## 2014-08-31 ENCOUNTER — Encounter (HOSPITAL_BASED_OUTPATIENT_CLINIC_OR_DEPARTMENT_OTHER): Payer: Self-pay | Admitting: *Deleted

## 2014-08-31 ENCOUNTER — Emergency Department (HOSPITAL_BASED_OUTPATIENT_CLINIC_OR_DEPARTMENT_OTHER): Payer: No Typology Code available for payment source

## 2014-08-31 ENCOUNTER — Emergency Department (HOSPITAL_BASED_OUTPATIENT_CLINIC_OR_DEPARTMENT_OTHER)
Admission: EM | Admit: 2014-08-31 | Discharge: 2014-08-31 | Disposition: A | Discharge status: Home / Self Care | Payer: No Typology Code available for payment source | Attending: Emergency Medicine | Admitting: Emergency Medicine

## 2014-08-31 DIAGNOSIS — Z791 Long term (current) use of non-steroidal anti-inflammatories (NSAID): Secondary | ICD-10-CM | POA: Insufficient documentation

## 2014-08-31 DIAGNOSIS — K219 Gastro-esophageal reflux disease without esophagitis: Secondary | ICD-10-CM | POA: Insufficient documentation

## 2014-08-31 DIAGNOSIS — Z7952 Long term (current) use of systemic steroids: Secondary | ICD-10-CM | POA: Diagnosis not present

## 2014-08-31 DIAGNOSIS — J209 Acute bronchitis, unspecified: Secondary | ICD-10-CM | POA: Insufficient documentation

## 2014-08-31 DIAGNOSIS — Z79899 Other long term (current) drug therapy: Secondary | ICD-10-CM | POA: Diagnosis not present

## 2014-08-31 DIAGNOSIS — R0602 Shortness of breath: Secondary | ICD-10-CM | POA: Diagnosis present

## 2014-08-31 MED ORDER — ALBUTEROL SULFATE (2.5 MG/3ML) 0.083% IN NEBU
5.0000 mg | INHALATION_SOLUTION | Freq: Once | RESPIRATORY_TRACT | Status: AC
  Administered 2014-08-31: 5 mg via RESPIRATORY_TRACT
  Filled 2014-08-31: qty 6

## 2014-08-31 MED ORDER — IPRATROPIUM-ALBUTEROL 0.5-2.5 (3) MG/3ML IN SOLN
3.0000 mL | Freq: Once | RESPIRATORY_TRACT | Status: AC
  Administered 2014-08-31: 3 mL via RESPIRATORY_TRACT
  Filled 2014-08-31: qty 3

## 2014-08-31 MED ORDER — IPRATROPIUM BROMIDE 0.02 % IN SOLN
0.5000 mg | Freq: Once | RESPIRATORY_TRACT | Status: AC
  Administered 2014-08-31: 0.5 mg via RESPIRATORY_TRACT
  Filled 2014-08-31: qty 2.5

## 2014-08-31 MED ORDER — ALBUTEROL SULFATE HFA 108 (90 BASE) MCG/ACT IN AERS
2.0000 | INHALATION_SPRAY | RESPIRATORY_TRACT | Status: DC | PRN
  Administered 2014-08-31: 2 via RESPIRATORY_TRACT
  Filled 2014-08-31: qty 6.7

## 2014-08-31 MED ORDER — AEROCHAMBER PLUS W/MASK MISC
1.0000 | Freq: Once | Status: AC
  Administered 2014-08-31: 1
  Filled 2014-08-31: qty 1

## 2014-08-31 MED ORDER — PREDNISONE 50 MG PO TABS: 50.0000 mg | ORAL_TABLET | Freq: Every day | ORAL | Status: DC

## 2014-08-31 MED ORDER — OXYMETAZOLINE HCL 0.05 % NA SOLN
1.0000 | Freq: Once | NASAL | Status: AC
  Administered 2014-08-31: 1 via NASAL
  Filled 2014-08-31: qty 15

## 2014-08-31 MED ORDER — PREDNISONE 10 MG PO TABS
60.0000 mg | ORAL_TABLET | Freq: Once | ORAL | Status: AC
  Administered 2014-08-31: 16:00:00 60 mg via ORAL
  Filled 2014-08-31 (×2): qty 1

## 2014-08-31 NOTE — ED Notes (Signed)
Pt receiving neb tx.  RRT at bedside.

## 2014-08-31 NOTE — Discharge Instructions (Signed)
Return to the ED with any concerns including difficulty breathing despite using albuterol 2 puffs every 4 hours, not drinking fluids, decreased urine output, vomiting and not able to keep down liquids or medications, decreased level of alertness/lethargy, or any other alarming symptoms  You can use 2 sprays of afrin in each nostril twice daily- do not use for more than 3 days due to risk of rebound/worsening nasal congestion

## 2014-08-31 NOTE — ED Notes (Signed)
Patient transported to X-ray 

## 2014-08-31 NOTE — ED Notes (Signed)
Pt c/o being SOB x4 days. Pt also c/o non-productive cough, HA and body aches.

## 2014-08-31 NOTE — ED Notes (Signed)
#  2 neb tx in process

## 2014-08-31 NOTE — ED Provider Notes (Signed)
CSN: [id number]     Arrival date & time 08/31/14  1500 History   First MD Initiated Contact with Patient 08/31/14 1508     Chief Complaint  Patient presents with  . Shortness of Breath     (Consider location/radiation/quality/duration/timing/severity/associated sxs/prior Treatment) HPI  Pt presenting with c/o feeling short of breath with nonproductive cough over the past 3-4 days.  Pt also states he has had nasal congestion and frontal headache over the same period of time.  No fever/chills.  No chest pain.  Pt has hx of seasonal allergies and has felt these are flaring up for the past couple of weeks.  He took mucinex x 1 today without much relief.  He had been taking zyrtec for his allergy symptoms.  No hx of asthma or wheezing.  There are no other associated systemic symptoms, there are no other alleviating or modifying factors.   Past Medical History  Diagnosis Date  . Reflux   . Pollen allergies    Past Surgical History  Procedure Laterality Date  . Wisdom tooth extraction     No family history on file. History  Substance Use Topics  . Smoking status: Never Smoker   . Smokeless tobacco: Never Used  . Alcohol Use: 0.0 oz/week    Review of Systems  ROS reviewed and all otherwise negative except for mentioned in HPI    Allergies  Review of patient's allergies indicates no known allergies.  Home Medications   Prior to Admission medications   Medication Sig Start Date End Date Taking? Authorizing Provider  cetirizine (ZYRTEC) 10 MG tablet Take 10 mg by mouth daily.   Yes Historical Provider, MD  guaiFENesin (MUCINEX) 600 MG 12 hr tablet Take by mouth 2 (two) times daily.   Yes Historical Provider, MD  amoxicillin-clavulanate (AUGMENTIN) 875-125 MG per tablet Take 1 tablet by mouth every 12 (twelve) hours. 02/03/13   Gerhard Munchobert Lockwood, MD  calcium carbonate (TUMS - DOSED IN MG ELEMENTAL CALCIUM) 500 MG chewable tablet Chew 1 tablet by mouth daily.    Historical Provider, MD   erythromycin ophthalmic ointment Place 1 application into both eyes at bedtime. 04/29/13   Bobbye Charlestonhristopher B Komanski, MD  HYDROcodone-acetaminophen (NORCO/VICODIN) 5-325 MG per tablet Take 1 tablet by mouth every 4 (four) hours as needed. 10/30/13   Teressa LowerVrinda Pickering, NP  ibuprofen (ADVIL,MOTRIN) 800 MG tablet Take 1 tablet (800 mg total) by mouth 3 (three) times daily. 10/30/13   Teressa LowerVrinda Pickering, NP  naproxen sodium (ANAPROX) 220 MG tablet Take 220 mg by mouth 2 (two) times daily with a meal.    Historical Provider, MD  ondansetron (ZOFRAN) 4 MG tablet Take 1 tablet (4 mg total) by mouth every 6 (six) hours. 07/12/13   Jillyn LedgerJessica K Palmer, PA-C  predniSONE (DELTASONE) 50 MG tablet Take 1 tablet (50 mg total) by mouth daily. 08/31/14   Jerelyn ScottMartha Linker, MD  sodium chloride (OCEAN) 0.65 % SOLN nasal spray Place 1 spray into the nose as needed (sinus congestion and ear fullness). 02/03/13   Gerhard Munchobert Lockwood, MD   BP 138/51 mmHg  Pulse 93  Temp(Src) 98.4 F (36.9 C) (Oral)  Resp 20  Wt 220 lb (99.791 kg)  SpO2 99%  Vitals reviewed Physical Exam  Physical Examination: General appearance - alert, well appearing, and in no distress Mental status - alert, oriented to person, place, and time Eyes - no conjunctival injection, no scleral icterus Mouth - mucous membranes moist, pharynx normal without lesions Chest - BBS, bilateral coarse wheezing,  no retractions or increased respiratory effort Heart - normal rate, regular rhythm, normal S1, S2, no murmurs, rubs, clicks or gallops Abdomen - soft, nontender, nondistended, no masses or organomegaly Extremities - peripheral pulses normal, no pedal edema, no clubbing or cyanosis Skin - normal coloration and turgor, no rashes  ED Course  Procedures (including critical care time) Labs Review Labs Reviewed - No data to display  Imaging Review Dg Chest 2 View  08/31/2014   CLINICAL DATA:  Four days history of shortness of breath and nonproductive cough with body aches,  nonsmoker  EXAM: CHEST  2 VIEW  COMPARISON:  None.  FINDINGS: The lungs are well-expanded. There is no focal infiltrate or pleural effusion. There is a right-sided aortic arch. The cardiac apex and gastric air bubble are left-sided. The cardiac silhouette is normal in size. The pulmonary vascularity is not engorged. There is no pleural effusion or pneumothorax.  IMPRESSION: There is no pneumonia nor other acute cardiopulmonary abnormality.   Electronically Signed   By: David  [place]   On: 08/31/2014 15:44     EKG Interpretation None      MDM   Final diagnoses:  Bronchospasm with bronchitis, acute    Pt presenting with c/o nasal congestion, cough and wheezing on exam.  Pt improved after 3 duonebs in the ED.  CXR reassuring- obtained as patient is first time wheezer.   Xray images reviewed and interpreted by me as well.  Pt started on prednisone as well.  Given albuterol MDI with spacer.  Return to the ED with any concerns including vomiting, seizure activity, worsening pain, changes in mental status, or any other alarming symptoms     Jerelyn Scott, MD 08/31/14 1733

## 2014-08-31 NOTE — ED Notes (Signed)
MD at bedside. 

## 2014-09-26 ENCOUNTER — Emergency Department (HOSPITAL_BASED_OUTPATIENT_CLINIC_OR_DEPARTMENT_OTHER)
Admission: EM | Admit: 2014-09-26 | Discharge: 2014-09-26 | Disposition: A | Discharge status: Home / Self Care | Payer: No Typology Code available for payment source | Attending: Emergency Medicine | Admitting: Emergency Medicine

## 2014-09-26 ENCOUNTER — Encounter (HOSPITAL_BASED_OUTPATIENT_CLINIC_OR_DEPARTMENT_OTHER): Payer: Self-pay | Admitting: *Deleted

## 2014-09-26 ENCOUNTER — Emergency Department (HOSPITAL_BASED_OUTPATIENT_CLINIC_OR_DEPARTMENT_OTHER): Payer: No Typology Code available for payment source

## 2014-09-26 DIAGNOSIS — Y92310 Basketball court as the place of occurrence of the external cause: Secondary | ICD-10-CM | POA: Diagnosis not present

## 2014-09-26 DIAGNOSIS — Z8719 Personal history of other diseases of the digestive system: Secondary | ICD-10-CM | POA: Insufficient documentation

## 2014-09-26 DIAGNOSIS — Y998 Other external cause status: Secondary | ICD-10-CM | POA: Insufficient documentation

## 2014-09-26 DIAGNOSIS — Y9367 Activity, basketball: Secondary | ICD-10-CM | POA: Diagnosis not present

## 2014-09-26 DIAGNOSIS — Z792 Long term (current) use of antibiotics: Secondary | ICD-10-CM | POA: Diagnosis not present

## 2014-09-26 DIAGNOSIS — X58XXXA Exposure to other specified factors, initial encounter: Secondary | ICD-10-CM | POA: Diagnosis not present

## 2014-09-26 DIAGNOSIS — Z7952 Long term (current) use of systemic steroids: Secondary | ICD-10-CM | POA: Insufficient documentation

## 2014-09-26 DIAGNOSIS — S93402A Sprain of unspecified ligament of left ankle, initial encounter: Secondary | ICD-10-CM | POA: Diagnosis not present

## 2014-09-26 DIAGNOSIS — Z79899 Other long term (current) drug therapy: Secondary | ICD-10-CM | POA: Diagnosis not present

## 2014-09-26 DIAGNOSIS — S99912A Unspecified injury of left ankle, initial encounter: Secondary | ICD-10-CM | POA: Diagnosis present

## 2014-09-26 MED ORDER — IBUPROFEN 800 MG PO TABS: 800.0000 mg | ORAL_TABLET | Freq: Three times a day (TID) | ORAL | Status: DC

## 2014-09-26 MED ORDER — HYDROCODONE-ACETAMINOPHEN 5-325 MG PO TABS: 1.0000 | ORAL_TABLET | ORAL | Status: AC | PRN

## 2014-09-26 MED ORDER — OXYCODONE-ACETAMINOPHEN 5-325 MG PO TABS
2.0000 | ORAL_TABLET | Freq: Once | ORAL | Status: AC
  Administered 2014-09-26: 2 via ORAL
  Filled 2014-09-26: qty 2

## 2014-09-26 NOTE — Discharge Instructions (Signed)
Rest, ice and elevate your ankle. Apply minimal weight to your ankle. Take Vicodin for severe pain only. No driving or operating heavy machinery while taking vicodin. This medication may cause drowsiness. Take ibuprofen as prescribed.  Ankle Sprain An ankle sprain is an injury to the strong, fibrous tissues (ligaments) that hold the bones of your ankle joint together.  CAUSES An ankle sprain is usually caused by a fall or by twisting your ankle. Ankle sprains most commonly occur when you step on the outer edge of your foot, and your ankle turns inward. People who participate in sports are more prone to these types of injuries.  SYMPTOMS   Pain in your ankle. The pain may be present at rest or only when you are trying to stand or walk.  Swelling.  Bruising. Bruising may develop immediately or within 1 to 2 days after your injury.  Difficulty standing or walking, particularly when turning corners or changing directions. DIAGNOSIS  Your caregiver will ask you details about your injury and perform a physical exam of your ankle to determine if you have an ankle sprain. During the physical exam, your caregiver will press on and apply pressure to specific areas of your foot and ankle. Your caregiver will try to move your ankle in certain ways. An X-ray exam may be done to be sure a bone was not broken or a ligament did not separate from one of the bones in your ankle (avulsion fracture).  TREATMENT  Certain types of braces can help stabilize your ankle. Your caregiver can make a recommendation for this. Your caregiver may recommend the use of medicine for pain. If your sprain is severe, your caregiver may refer you to a surgeon who helps to restore function to parts of your skeletal system (orthopedist) or a physical therapist. HOME CARE INSTRUCTIONS   Apply ice to your injury for 1-2 days or as directed by your caregiver. Applying ice helps to reduce inflammation and pain.  Put ice in a plastic  bag.  Place a towel between your skin and the bag.  Leave the ice on for 15-20 minutes at a time, every 2 hours while you are awake.  Only take over-the-counter or prescription medicines for pain, discomfort, or fever as directed by your caregiver.  Elevate your injured ankle above the level of your heart as much as possible for 2-3 days.  If your caregiver recommends crutches, use them as instructed. Gradually put weight on the affected ankle. Continue to use crutches or a cane until you can walk without feeling pain in your ankle.  If you have a plaster splint, wear the splint as directed by your caregiver. Do not rest it on anything harder than a pillow for the first 24 hours. Do not put weight on it. Do not get it wet. You may take it off to take a shower or bath.  You may have been given an elastic bandage to wear around your ankle to provide support. If the elastic bandage is too tight (you have numbness or tingling in your foot or your foot becomes cold and blue), adjust the bandage to make it comfortable.  If you have an air splint, you may blow more air into it or let air out to make it more comfortable. You may take your splint off at night and before taking a shower or bath. Wiggle your toes in the splint several times per day to decrease swelling. SEEK MEDICAL CARE IF:   You have rapidly  increasing bruising or swelling.  Your toes feel extremely cold or you lose feeling in your foot.  Your pain is not relieved with medicine. SEEK IMMEDIATE MEDICAL CARE IF:  Your toes are numb or blue.  You have severe pain that is increasing. MAKE SURE YOU:   Understand these instructions.  Will watch your condition.  Will get help right away if you are not doing well or get worse. Document Released: 05/18/2005 Document Revised: 02/10/2012 Document Reviewed: 05/30/2011 [facility]ExitCare Patient Information 2015 [place], [place]. This information is not intended to replace advice given to you by  your health care provider. Make sure you discuss any questions you have with your health care provider. RICE: Routine Care for Injuries The routine care of many injuries includes Rest, Ice, Compression, and Elevation (RICE). HOME CARE INSTRUCTIONS  Rest is needed to allow your body to heal. Routine activities can usually be resumed when comfortable. Injured tendons and bones can take up to 6 weeks to heal. Tendons are the cord-like structures that attach muscle to bone.  Ice following an injury helps keep the swelling down and reduces pain.  Put ice in a plastic bag.  Place a towel between your skin and the bag.  Leave the ice on for 15-20 minutes, 3-4 times a day, or as directed by your health care provider. Do this while awake, for the first 24 to 48 hours. After that, continue as directed by your caregiver.  Compression helps keep swelling down. It also gives support and helps with discomfort. If an elastic bandage has been applied, it should be removed and reapplied every 3 to 4 hours. It should not be applied tightly, but firmly enough to keep swelling down. Watch fingers or toes for swelling, bluish discoloration, coldness, numbness, or excessive pain. If any of these problems occur, remove the bandage and reapply loosely. Contact your caregiver if these problems continue.  Elevation helps reduce swelling and decreases pain. With extremities, such as the arms, hands, legs, and feet, the injured area should be placed near or above the level of the heart, if possible. SEEK IMMEDIATE MEDICAL CARE IF:  You have persistent pain and swelling.  You develop redness, numbness, or unexpected weakness.  Your symptoms are getting worse rather than improving after several days. These symptoms may indicate that further evaluation or further X-rays are needed. Sometimes, X-rays may not show a small broken bone (fracture) until 1 week or 10 days later. Make a follow-up appointment with your caregiver.  Ask when your X-ray results will be ready. Make sure you get your X-ray results. Document Released: 08/30/2000 Document Revised: 05/23/2013 Document Reviewed: 10/17/2010 [facility]ExitCare Patient Information 2015 [place], [place]. This information is not intended to replace advice given to you by your health care provider. Make sure you discuss any questions you have with your health care provider.

## 2014-09-26 NOTE — ED Notes (Signed)
Patient transported to X-ray 

## 2014-09-26 NOTE — ED Notes (Signed)
Pt c/o left ankle injury x 2 hrs ago while playing basketball

## 2014-09-26 NOTE — ED Provider Notes (Signed)
CSN: [id number]     Arrival date & time 09/26/14  2105 History   First MD Initiated Contact with Patient 09/26/14 2138     Chief Complaint  Patient presents with  . Ankle Injury     (Consider location/radiation/quality/duration/timing/severity/associated sxs/prior Treatment) HPI Comments: 29 year old male complaining of left ankle pain after jumping, and twisting it while playing basketball 2 hours prior to arrival. States his ankle started to swell immediately, pain 10/10, worse with pressure and walking. Keeping pressure off of it. Denies numbness or tingling. No medications prior to arrival.  Patient is a 29 y.o. male presenting with lower extremity injury. The history is provided by the patient.  Ankle Injury This is a new problem. The current episode started today. The problem has been gradually worsening. Pertinent negatives include no numbness. The symptoms are aggravated by walking. He has tried nothing for the symptoms.    Past Medical History  Diagnosis Date  . Reflux   . Pollen allergies    Past Surgical History  Procedure Laterality Date  . Wisdom tooth extraction     History reviewed. No pertinent family history. History  Substance Use Topics  . Smoking status: Never Smoker   . Smokeless tobacco: Never Used  . Alcohol Use: 0.0 oz/week    Review of Systems  Constitutional: Negative.   HENT: Negative.   Respiratory: Negative.   Cardiovascular: Negative.   Musculoskeletal:       + L ankle pain and swelling.  Skin: Negative for wound.  Neurological: Negative for numbness.      Allergies  Review of patient's allergies indicates no known allergies.  Home Medications   Prior to Admission medications   Medication Sig Start Date End Date Taking? Authorizing Provider  amoxicillin-clavulanate (AUGMENTIN) 875-125 MG per tablet Take 1 tablet by mouth every 12 (twelve) hours. 02/03/13   Gerhard Munch, MD  calcium carbonate (TUMS - DOSED IN MG ELEMENTAL CALCIUM)  500 MG chewable tablet Chew 1 tablet by mouth daily.    Historical Provider, MD  cetirizine (ZYRTEC) 10 MG tablet Take 10 mg by mouth daily.    Historical Provider, MD  erythromycin ophthalmic ointment Place 1 application into both eyes at bedtime. 04/29/13   Bobbye Charleston, MD  guaiFENesin (MUCINEX) 600 MG 12 hr tablet Take by mouth 2 (two) times daily.    Historical Provider, MD  HYDROcodone-acetaminophen (NORCO/VICODIN) 5-325 MG per tablet Take 1 tablet by mouth every 4 (four) hours as needed. 10/30/13   Teressa Lower, NP  HYDROcodone-acetaminophen (NORCO/VICODIN) 5-325 MG per tablet Take 1-2 tablets by mouth every 4 (four) hours as needed. 09/26/14   Dejanay Wamboldt M Chrishon Martino, PA-C  ibuprofen (ADVIL,MOTRIN) 800 MG tablet Take 1 tablet (800 mg total) by mouth 3 (three) times daily. 10/30/13   Teressa Lower, NP  ibuprofen (ADVIL,MOTRIN) 800 MG tablet Take 1 tablet (800 mg total) by mouth 3 (three) times daily. 09/26/14   Armend Hochstatter M Minnie Legros, PA-C  naproxen sodium (ANAPROX) 220 MG tablet Take 220 mg by mouth 2 (two) times daily with a meal.    Historical Provider, MD  ondansetron (ZOFRAN) 4 MG tablet Take 1 tablet (4 mg total) by mouth every 6 (six) hours. 07/12/13   Jillyn Ledger, PA-C  predniSONE (DELTASONE) 50 MG tablet Take 1 tablet (50 mg total) by mouth daily. 08/31/14   Jerelyn Scott, MD  sodium chloride (OCEAN) 0.65 % SOLN nasal spray Place 1 spray into the nose as needed (sinus congestion and ear fullness). 02/03/13  Gerhard Munchobert Lockwood, MD   BP 121/80 mmHg  Pulse 67  Temp(Src) 98.2 F (36.8 C)  Resp 18  SpO2 100% Physical Exam  Constitutional: He is oriented to person, place, and time. He appears well-developed and well-nourished. No distress.  HENT:  Head: Normocephalic and atraumatic.  Eyes: Conjunctivae and EOM are normal.  Neck: Normal range of motion. Neck supple.  Cardiovascular: Normal rate, regular rhythm and normal heart sounds.   Pulmonary/Chest: Effort normal and breath sounds normal.   Musculoskeletal:  L ankle with significant swelling laterally, mild swelling medially. Tender throughout. Achilles tendon intact. Negative Thompson test. No tenderness to proximal fibula. Ankle ROM limited by pain. +2 PT/DP pulse. Able to wiggle toes. Cap refill < 3 seconds.  Neurological: He is alert and oriented to person, place, and time.  Skin: Skin is warm and dry.  Psychiatric: He has a normal mood and affect. His behavior is normal.  Nursing note and vitals reviewed.   ED Course  Procedures (including critical care time) Labs Review Labs Reviewed - No data to display  Imaging Review Dg Ankle Complete Left  09/26/2014   CLINICAL DATA:  29 year old male with a history of left ankle injury 2 days prior  EXAM: LEFT ANKLE COMPLETE - 3+ VIEW  COMPARISON:  None.  FINDINGS: No acute displaced fracture identified. Significant soft tissue swelling on the lateral aspect. Mortise is congruent. Mild irregularity along the talus, inferior to the fibula, potentially an avulsion fracture of ligamentous insertion.  Degenerative changes of the midfoot and hindfoot.  IMPRESSION: No definite displaced fracture identified, with significant lateral ankle swelling.  There is a questionable bony density inferior to the fibula adjacent to the talus, potentially an avulsion fracture at the ligamentous insertion.  Signed,  Yvone NeuJaime S. Loreta AveWagner, DO  [facility]  [facility]   Electronically Signed   By: Gilmer MorJaime  Wagner D.O.   On: 09/26/2014 21:31     EKG Interpretation None      MDM   Final diagnoses:  Left ankle sprain, initial encounter   Neurovascularly intact. X-ray results as stated above. Patient placed in Ace wrap along with Cam Walker. Crutches given. Discharge with ibuprofen and Vicodin. Advised RICE. F/u upstairs with Dr. Pearletha ForgeHudnall advised. Stable for d/c. Return precautions given. Patient states understanding of treatment care plan and is agreeable.  Kathrynn SpeedRobyn  M Chonita Gadea, PA-C 09/26/14 2153  Gwyneth SproutWhitney Plunkett, MD 09/26/14 [phone]

## 2014-10-02 ENCOUNTER — Ambulatory Visit (INDEPENDENT_AMBULATORY_CARE_PROVIDER_SITE_OTHER): Payer: No Typology Code available for payment source | Admitting: Family Medicine

## 2014-10-02 ENCOUNTER — Encounter: Payer: Self-pay | Admitting: Family Medicine

## 2014-10-02 VITALS — BP 103/66 | HR 66 | Ht 73.0 in | Wt 220.0 lb

## 2014-10-02 DIAGNOSIS — S99912A Unspecified injury of left ankle, initial encounter: Secondary | ICD-10-CM | POA: Diagnosis not present

## 2014-10-02 NOTE — Patient Instructions (Addendum)
You have a Grade 2 ankle sprain. Ice the area for 15 minutes at a time, 3-4 times a day Aleve 2 tabs twice a day with food OR ibuprofen 3 tabs three times a day with food for pain and inflammation. Elevate above the level of your heart when possible Use laceup ankle brace to help with stability while you recover from this injury (usually for a total of 6 weeks). Come out of the boot/brace twice a day to do Up/down and alphabet exercises 2-3 sets of each. Start theraband strengthening exercises in about a week - once a day 3 sets of 10. Consider physical therapy for strengthening and balance exercises in the future. Follow up with me in 2 weeks for reevaluation. Ok for full duty at work - just make sure you wear your brace.

## 2014-10-04 DIAGNOSIS — S99912A Unspecified injury of left ankle, initial encounter: Secondary | ICD-10-CM | POA: Insufficient documentation

## 2014-10-04 NOTE — Progress Notes (Signed)
PCP: No PCP Per Patient  Subjective:   HPI: Patient is a 29 y.o. male here for left ankle injury.  Patient reports he was playing basketball on 4/27 - came down from a rebound and inverted his left ankle on someone's foot. Could bear weight. Pain up to 5/10 at times. + swelling. Used crutches, boot initially. No prior injuries. Has been icing, taking ibuprofen and norco. Pain is lateral.  Past Medical History  Diagnosis Date  . Reflux   . Pollen allergies     Current Outpatient Prescriptions on File Prior to Visit  Medication Sig Dispense Refill  . cetirizine (ZYRTEC) 10 MG tablet Take 10 mg by mouth daily.    Marland Kitchen. HYDROcodone-acetaminophen (NORCO/VICODIN) 5-325 MG per tablet Take 1-2 tablets by mouth every 4 (four) hours as needed. 10 tablet 0  . ibuprofen (ADVIL,MOTRIN) 800 MG tablet Take 1 tablet (800 mg total) by mouth 3 (three) times daily. 20 tablet 0   No current facility-administered medications on file prior to visit.    Past Surgical History  Procedure Laterality Date  . Wisdom tooth extraction      No Known Allergies  History   Social History  . Marital Status: Married    Spouse Name: N/A  . Number of Children: N/A  . Years of Education: N/A   Occupational History  . Not on file.   Social History Main Topics  . Smoking status: Never Smoker   . Smokeless tobacco: Never Used  . Alcohol Use: 0.0 oz/week    0 Standard drinks or equivalent per week  . Drug Use: No  . Sexual Activity: Not on file   Other Topics Concern  . Not on file   Social History Narrative    No family history on file.  BP 103/66 mmHg  Pulse 66  Ht 6\' 1"  (1.854 m)  Wt 220 lb (99.791 kg)  BMI 29.03 kg/m2  Review of Systems: See HPI above.    Objective:  Physical Exam:  Gen: NAD  Left ankle/foot: Mild lateral ankle swelling.  No bruising, other deformity. FROM TTP over ATFL. 1+ anterior drawer, trace talar tilt.   Negative syndesmotic compression. Thompsons test  negative. NV intact distally.    Assessment & Plan:  1. Left ankle injury - 2/2 grade 2 sprain.  Icing, nsaids.  ASO for stability.  Shown home exercises to do daily with theraband.  F/u in 2 weeks.  Full duty at work with the brace.

## 2014-10-04 NOTE — Assessment & Plan Note (Signed)
2/2 grade 2 sprain.  Icing, nsaids.  ASO for stability.  Shown home exercises to do daily with theraband.  F/u in 2 weeks.  Full duty at work with the brace.

## 2014-10-16 ENCOUNTER — Encounter (INDEPENDENT_AMBULATORY_CARE_PROVIDER_SITE_OTHER): Payer: Self-pay

## 2014-10-16 ENCOUNTER — Ambulatory Visit (INDEPENDENT_AMBULATORY_CARE_PROVIDER_SITE_OTHER): Payer: No Typology Code available for payment source | Admitting: Family Medicine

## 2014-10-16 ENCOUNTER — Encounter: Payer: Self-pay | Admitting: Family Medicine

## 2014-10-16 VITALS — BP 136/73 | HR 56 | Ht 73.0 in | Wt 220.0 lb

## 2014-10-16 DIAGNOSIS — S99912D Unspecified injury of left ankle, subsequent encounter: Secondary | ICD-10-CM | POA: Diagnosis not present

## 2014-10-16 NOTE — Patient Instructions (Signed)
Continue home exercises for another 4 weeks. Wear ankle brace with sports for next 6 weeks. i'd wait another week before playing any cutting sports though like basketball. Icing 15 minutes at a time 3-4 times a day for the swelling. Elevate as well as you have been. Follow up with me in 4 weeks only if you're having problems.

## 2014-10-18 NOTE — Assessment & Plan Note (Signed)
2/2 grade 2 sprain.  Improving.  Do home exercises for 4 more weeks.  ASO with sports for about 6 weeks.  Icing, nsaids if needed.  F/u in 4 weeks or prn.

## 2014-10-18 NOTE — Progress Notes (Signed)
PCP: No PCP Per Patient  Subjective:   HPI: Patient is a 29 y.o. male here for left ankle injury.  5/3: Patient reports he was playing basketball on 4/27 - came down from a rebound and inverted his left ankle on someone's foot. Could bear weight. Pain up to 5/10 at times. + swelling. Used crutches, boot initially. No prior injuries. Has been icing, taking ibuprofen and norco. Pain is lateral.  5/17: Patient reports he is about 80% improved. Slight swelling. Able to bear weight without problems. Pain level gets up to 3/10 at most. Took 2 aleve yesterday. Doing home exercises. Using ASO.  Past Medical History  Diagnosis Date  . Reflux   . Pollen allergies     Current Outpatient Prescriptions on File Prior to Visit  Medication Sig Dispense Refill  . cetirizine (ZYRTEC) 10 MG tablet Take 10 mg by mouth daily.    Marland Kitchen. HYDROcodone-acetaminophen (NORCO/VICODIN) 5-325 MG per tablet Take 1-2 tablets by mouth every 4 (four) hours as needed. 10 tablet 0  . ibuprofen (ADVIL,MOTRIN) 800 MG tablet Take 1 tablet (800 mg total) by mouth 3 (three) times daily. 20 tablet 0   No current facility-administered medications on file prior to visit.    Past Surgical History  Procedure Laterality Date  . Wisdom tooth extraction      No Known Allergies  History   Social History  . Marital Status: Married    Spouse Name: N/A  . Number of Children: N/A  . Years of Education: N/A   Occupational History  . Not on file.   Social History Main Topics  . Smoking status: Never Smoker   . Smokeless tobacco: Never Used  . Alcohol Use: 0.0 oz/week    0 Standard drinks or equivalent per week  . Drug Use: No  . Sexual Activity: Not on file   Other Topics Concern  . Not on file   Social History Narrative    No family history on file.  BP 136/73 mmHg  Pulse 56  Ht 6\' 1"  (1.854 m)  Wt 220 lb (99.791 kg)  BMI 29.03 kg/m2  Review of Systems: See HPI above.    Objective:  Physical  Exam:  Gen: NAD  Left ankle/foot: Mild lateral ankle swelling.  No bruising, other deformity. FROM Minimal TTP over ATFL. 1+ anterior drawer, trace talar tilt.   Negative syndesmotic compression. Thompsons test negative. NV intact distally.    Assessment & Plan:  1. Left ankle injury - 2/2 grade 2 sprain.  Improving.  Do home exercises for 4 more weeks.  ASO with sports for about 6 weeks.  Icing, nsaids if needed.  F/u in 4 weeks or prn.

## 2015-06-05 IMAGING — CR DG CHEST 2V
2 series · 2 of 2 positions shown · non-contrast
Comparison: None.

CLINICAL DATA: Four days history of shortness of breath and
nonproductive cough with body aches, nonsmoker

EXAM:
CHEST  2 VIEW

[w chest pa]
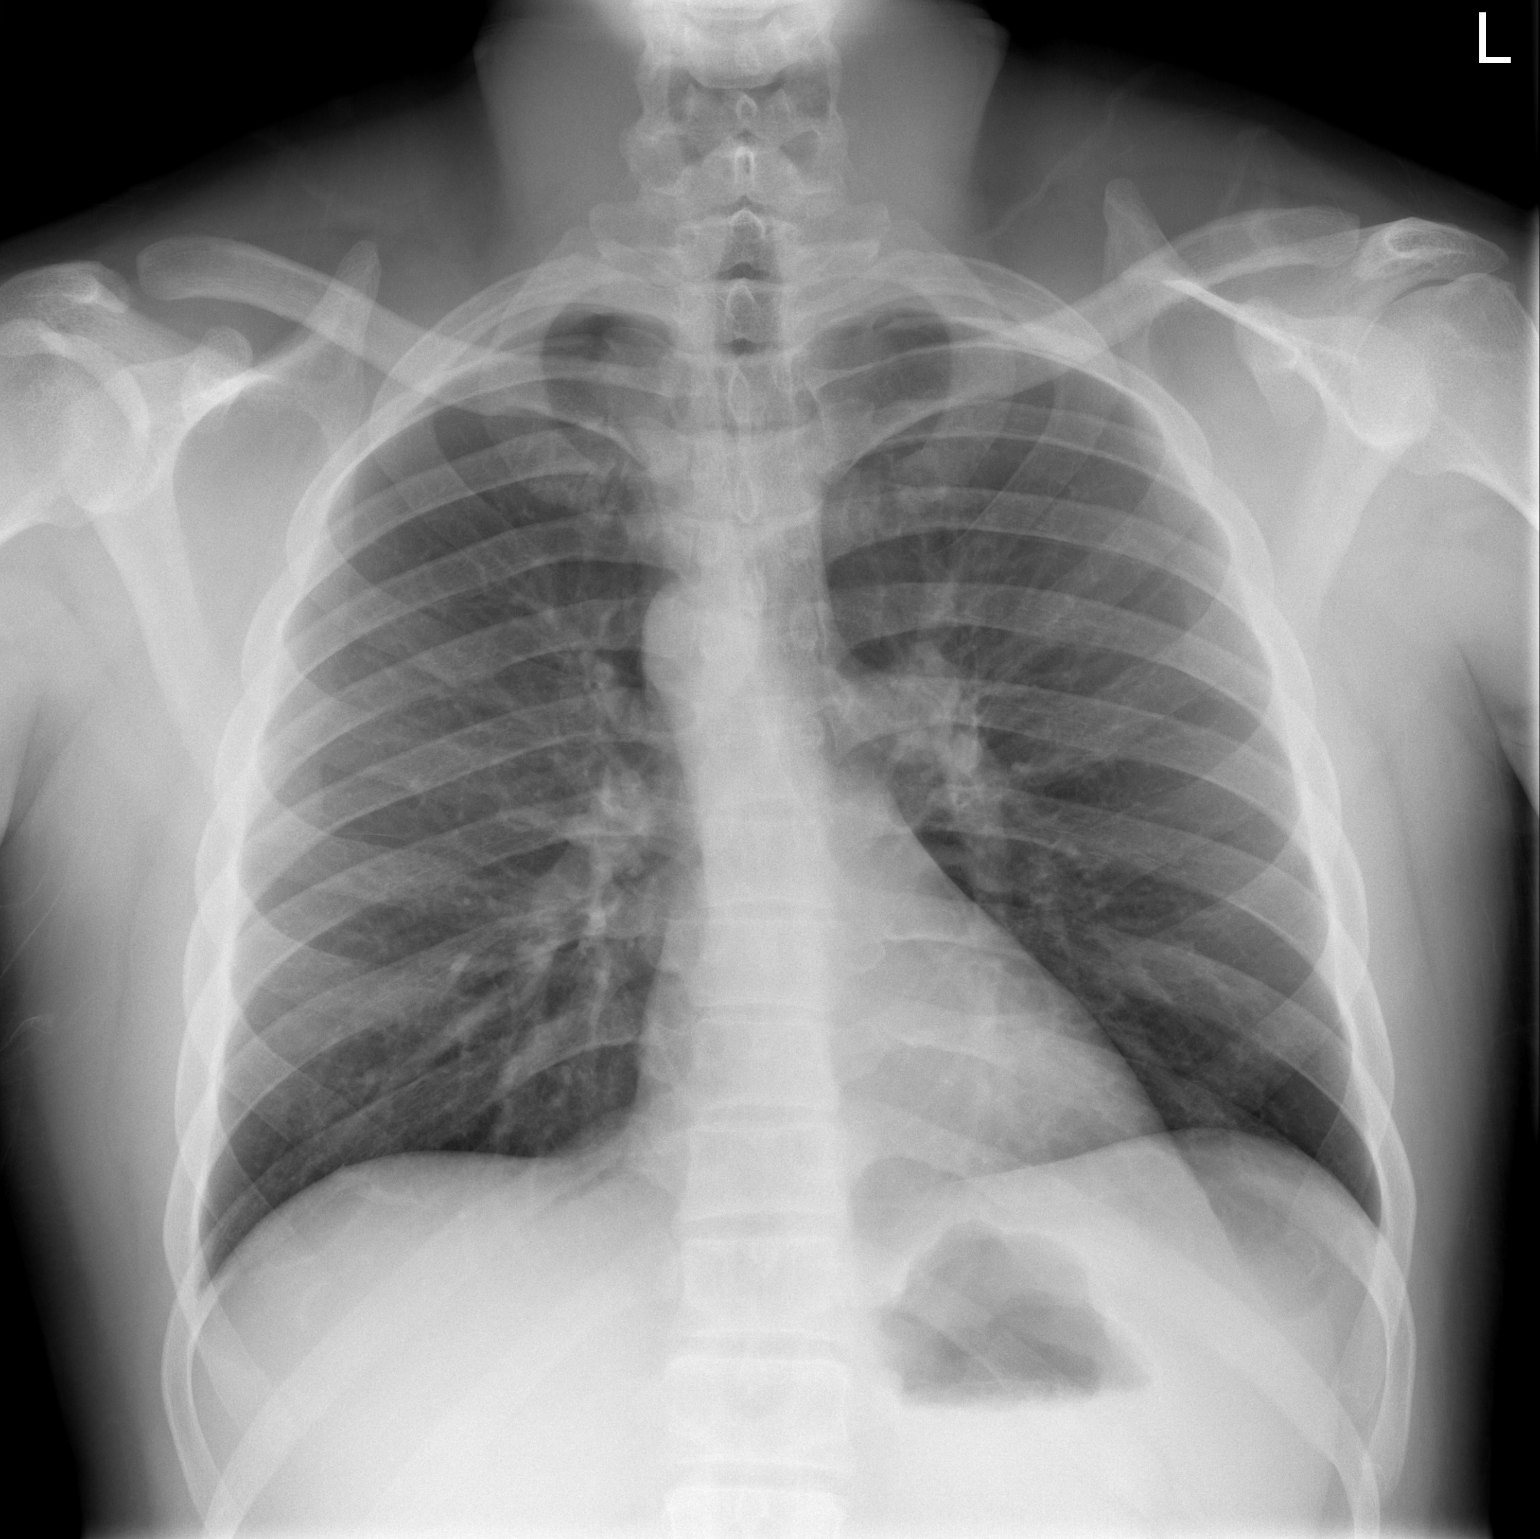

[w chest lat]
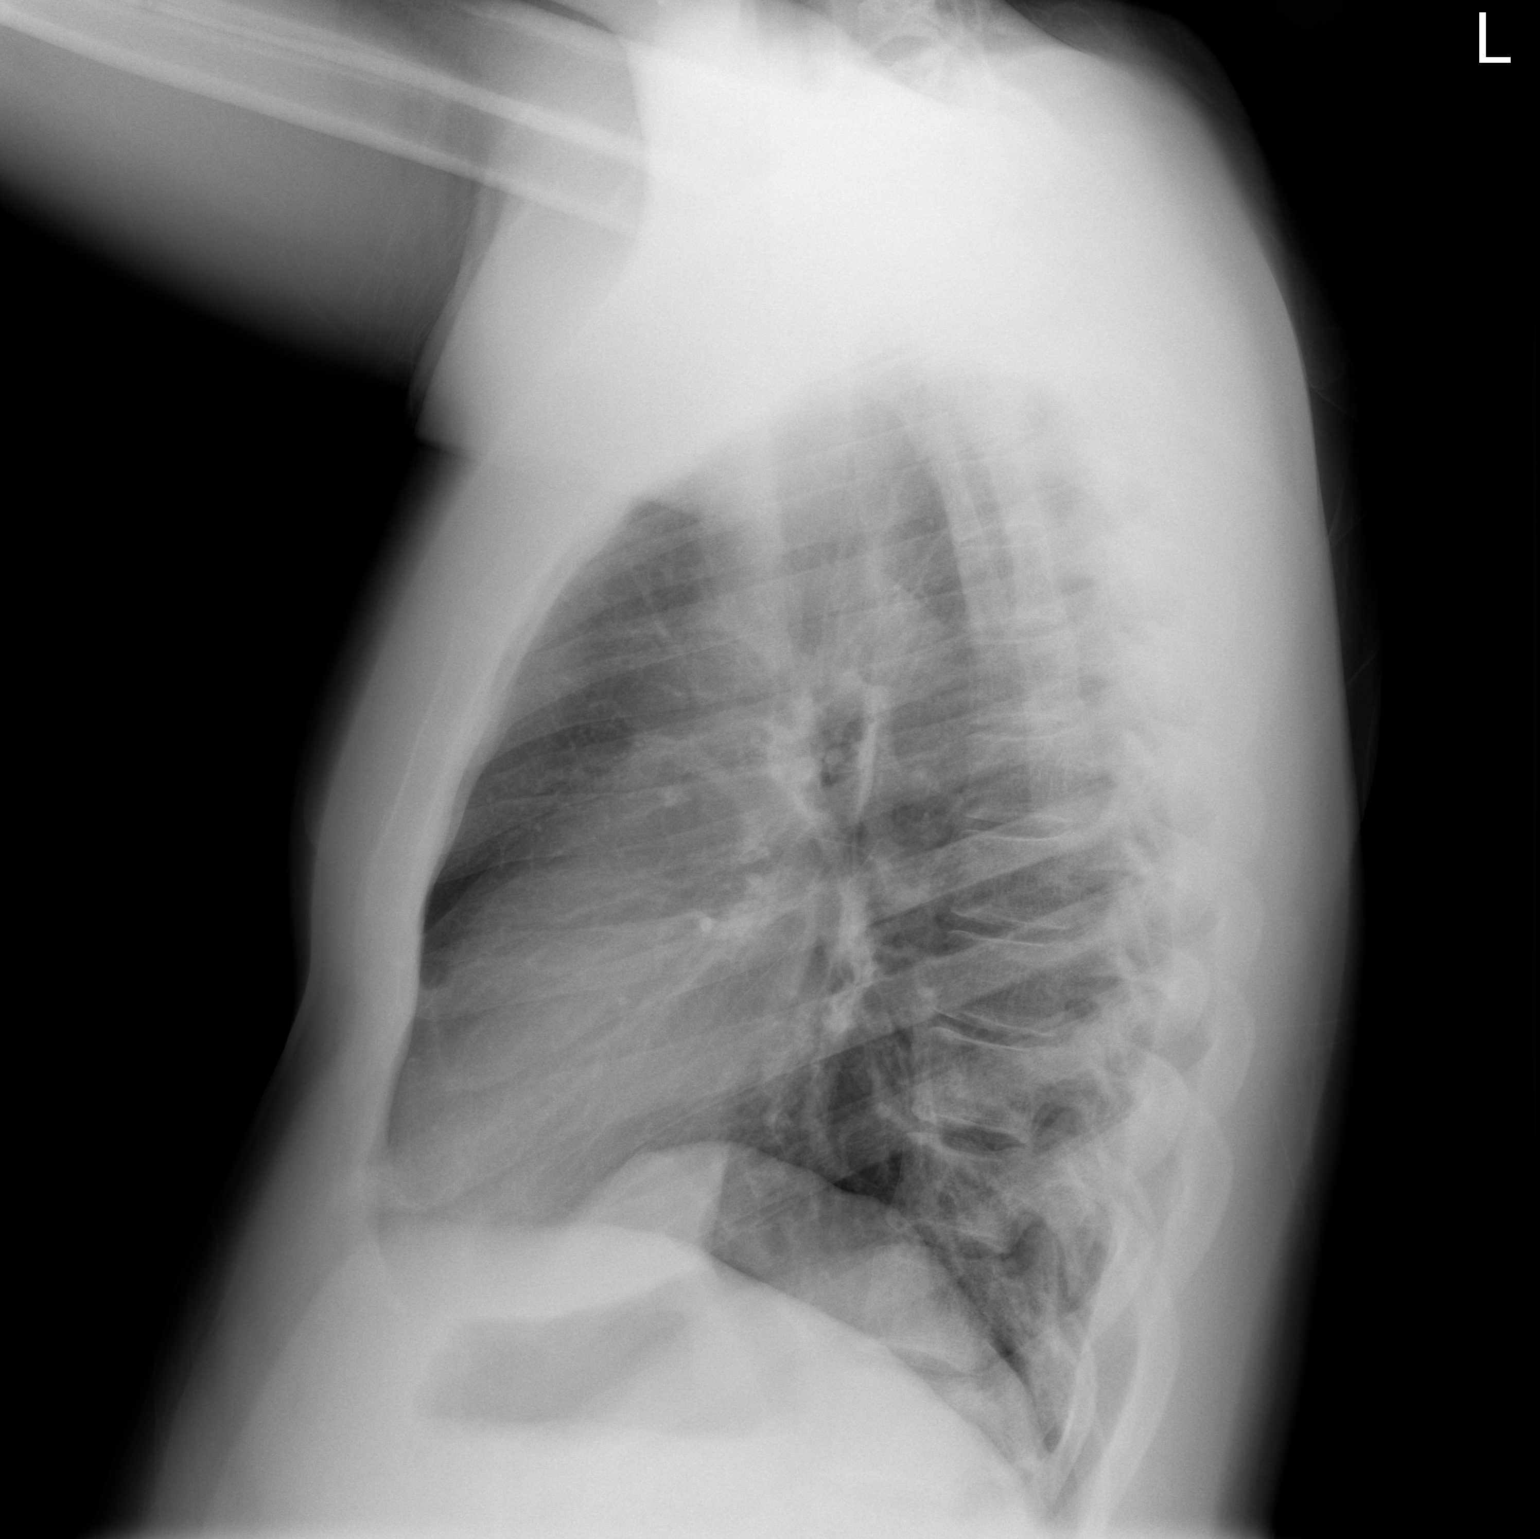

[2 of 2 positions shown; findings below may reference images not displayed]

FINDINGS: The lungs are well-expanded. There is no focal infiltrate or pleural
effusion. There is a right-sided aortic arch. The cardiac apex and
gastric air bubble are left-sided. The cardiac silhouette is normal
in size. The pulmonary vascularity is not engorged. There is no
pleural effusion or pneumothorax.
IMPRESSION: There is no pneumonia nor other acute cardiopulmonary abnormality.

## 2015-07-01 IMAGING — DX DG ANKLE COMPLETE 3+V*L*
3 series · 3 of 3 positions shown · non-contrast
Comparison: None.

CLINICAL DATA: 29-year-old male with a history of left ankle injury
2 days prior

EXAM:
LEFT ANKLE COMPLETE - 3+ VIEW

[ankle ap]
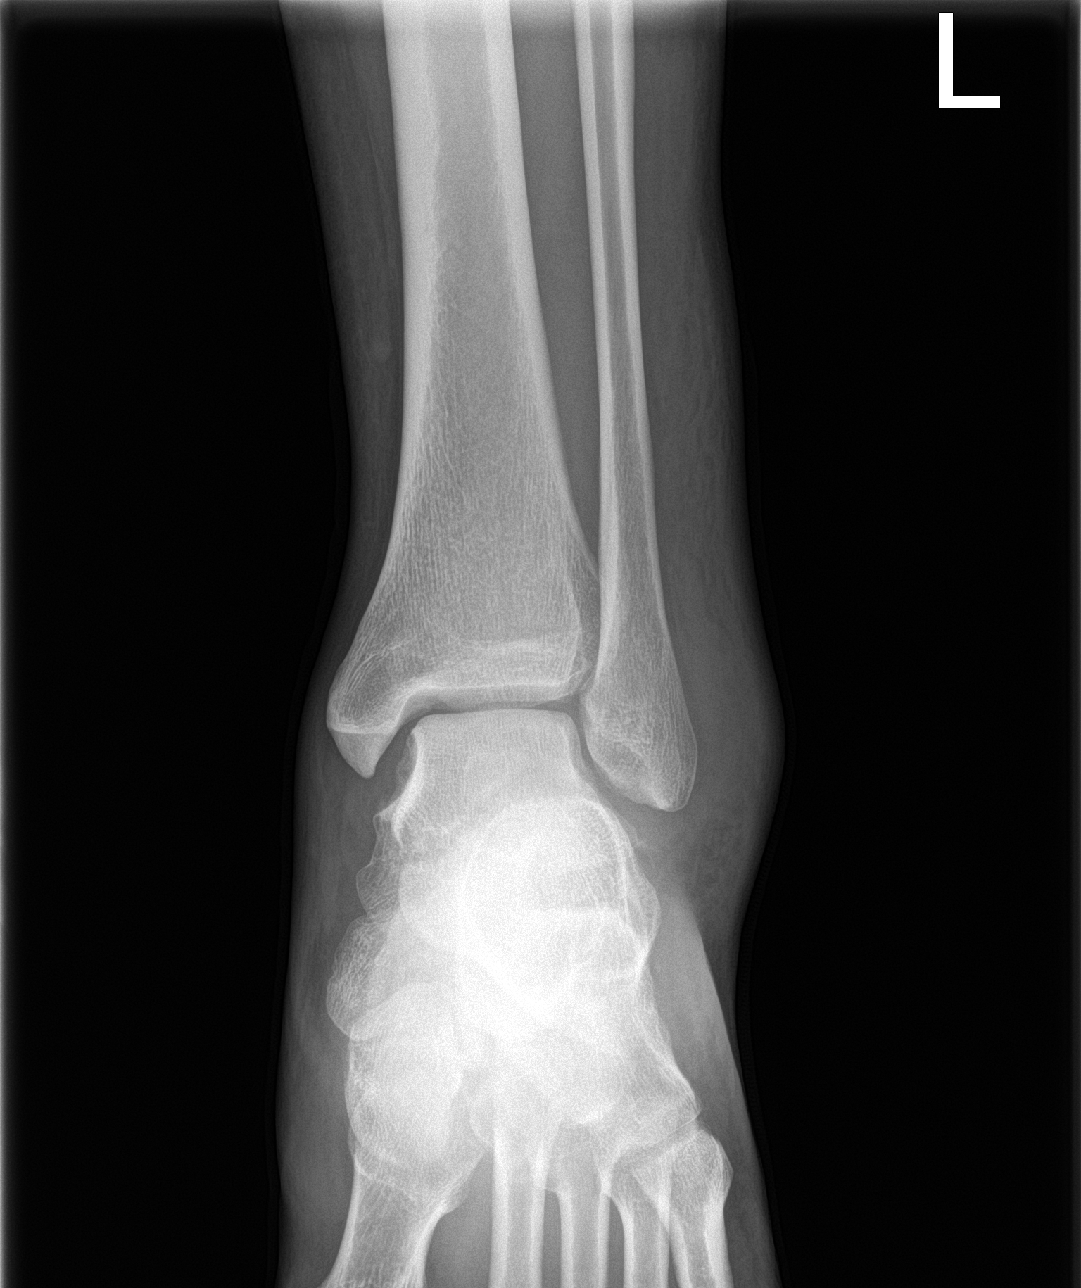

[ankle obl]
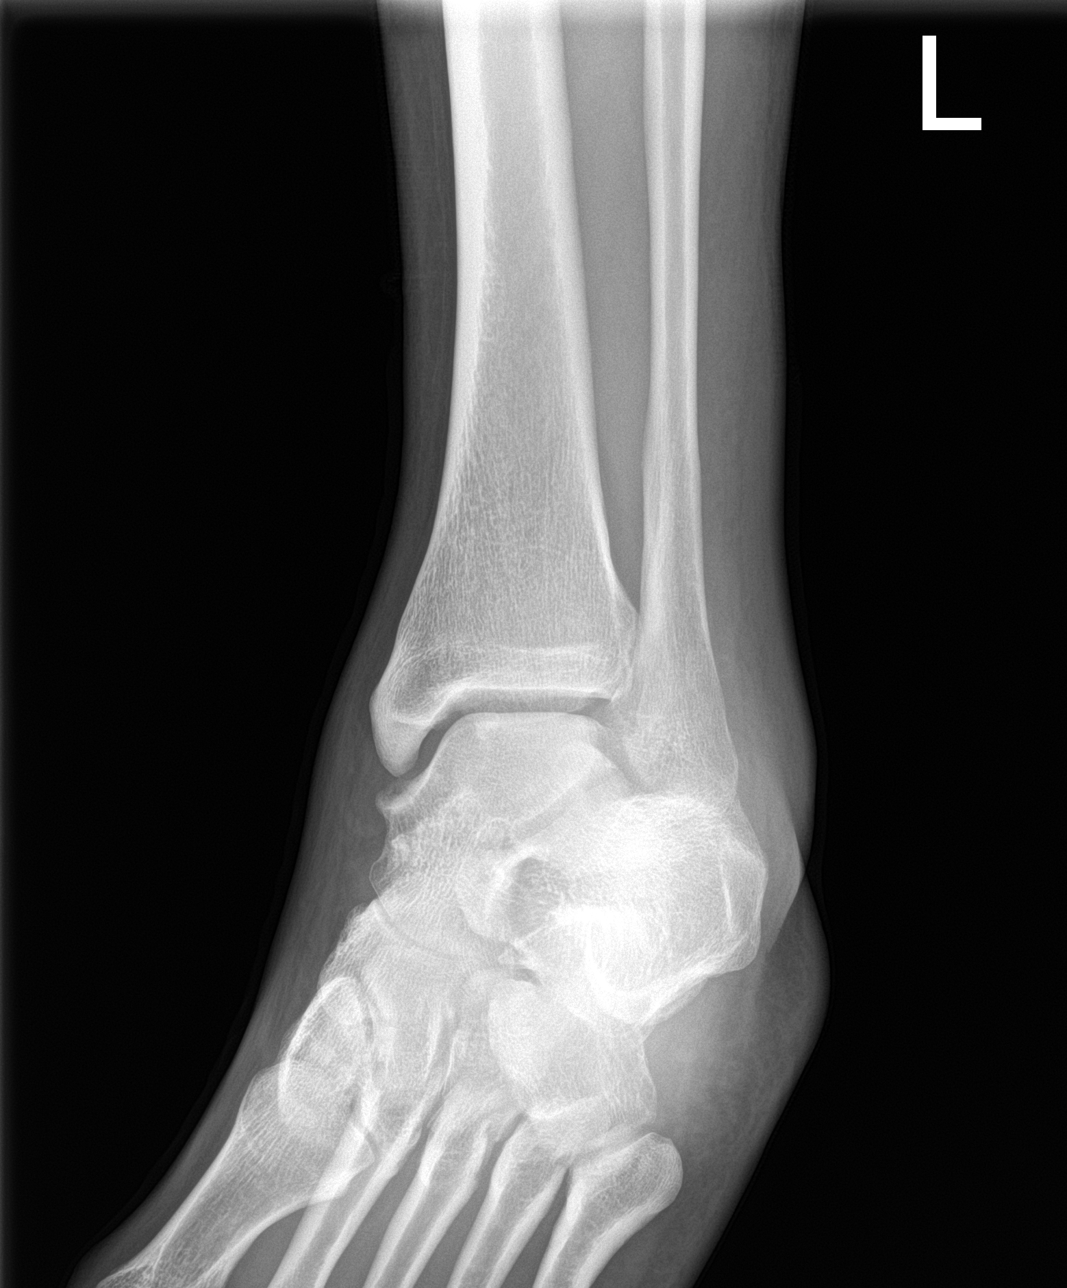

[ankle lat]
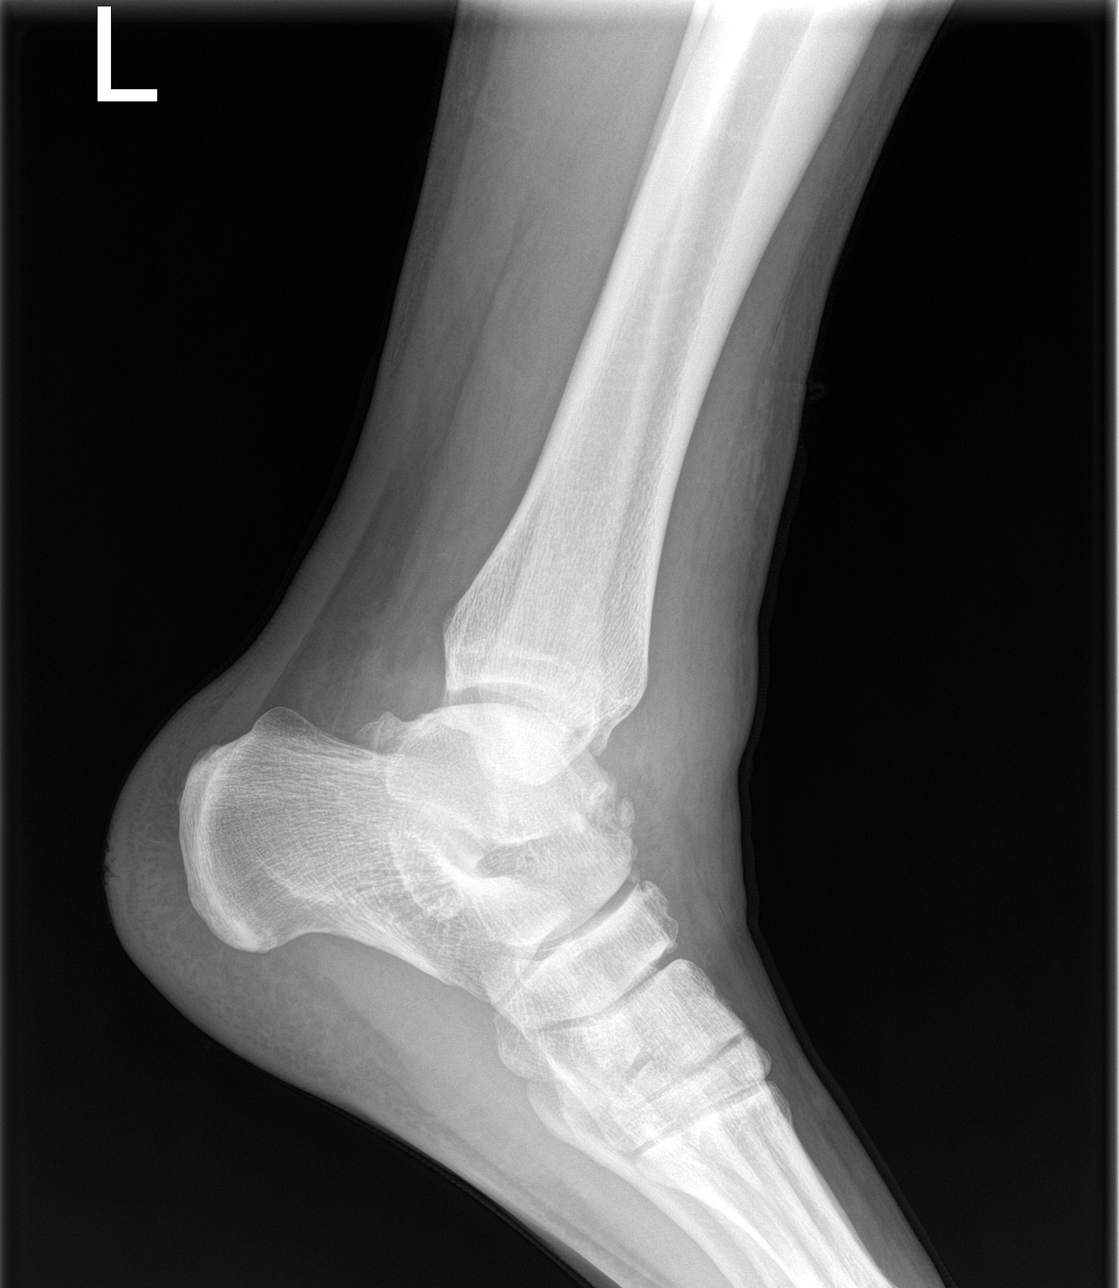

[3 of 3 positions shown; findings below may reference images not displayed]

FINDINGS: No acute displaced fracture identified. Significant soft tissue
swelling on the lateral aspect. Mortise is congruent. Mild
irregularity along the talus, inferior to the fibula, potentially an
avulsion fracture of ligamentous insertion.

Degenerative changes of the midfoot and hindfoot.
IMPRESSION: No definite displaced fracture identified, with significant lateral
ankle swelling.

There is a questionable bony density inferior to the fibula adjacent
to the talus, potentially an avulsion fracture at the ligamentous
insertion.

## 2019-03-20 ENCOUNTER — Emergency Department (HOSPITAL_BASED_OUTPATIENT_CLINIC_OR_DEPARTMENT_OTHER)
Admission: EM | Admit: 2019-03-20 | Discharge: 2019-03-20 | Disposition: A | Discharge status: Home / Self Care | Payer: Federal, State, Local not specified - PPO | Attending: Emergency Medicine | Admitting: Emergency Medicine

## 2019-03-20 ENCOUNTER — Encounter (HOSPITAL_BASED_OUTPATIENT_CLINIC_OR_DEPARTMENT_OTHER): Payer: Self-pay | Admitting: *Deleted

## 2019-03-20 ENCOUNTER — Other Ambulatory Visit: Payer: Self-pay

## 2019-03-20 DIAGNOSIS — T782XXA Anaphylactic shock, unspecified, initial encounter: Secondary | ICD-10-CM | POA: Insufficient documentation

## 2019-03-20 DIAGNOSIS — Z79899 Other long term (current) drug therapy: Secondary | ICD-10-CM | POA: Diagnosis not present

## 2019-03-20 DIAGNOSIS — T7840XA Allergy, unspecified, initial encounter: Secondary | ICD-10-CM | POA: Diagnosis present

## 2019-03-20 DIAGNOSIS — F17210 Nicotine dependence, cigarettes, uncomplicated: Secondary | ICD-10-CM | POA: Insufficient documentation

## 2019-03-20 MED ORDER — FAMOTIDINE IN NACL 20-0.9 MG/50ML-% IV SOLN
20.0000 mg | Freq: Once | INTRAVENOUS | Status: AC
  Administered 2019-03-20: 03:00:00 20 mg via INTRAVENOUS
  Filled 2019-03-20: qty 50

## 2019-03-20 MED ORDER — METHYLPREDNISOLONE SODIUM SUCC 125 MG IJ SOLR
125.0000 mg | Freq: Once | INTRAMUSCULAR | Status: AC
  Administered 2019-03-20: 04:00:00 125 mg via INTRAVENOUS
  Filled 2019-03-20: qty 2

## 2019-03-20 MED ORDER — DIPHENHYDRAMINE HCL 25 MG PO TABS: 25.0000 mg | ORAL_TABLET | Freq: Four times a day (QID) | ORAL | 0 refills | Status: AC | PRN

## 2019-03-20 MED ORDER — DIPHENHYDRAMINE HCL 50 MG/ML IJ SOLN
25.0000 mg | Freq: Once | INTRAMUSCULAR | Status: AC
  Administered 2019-03-20: 03:00:00 25 mg via INTRAVENOUS
  Filled 2019-03-20: qty 1

## 2019-03-20 MED ORDER — EPINEPHRINE 0.3 MG/0.3ML IJ SOAJ: 0.3000 mg | INTRAMUSCULAR | 0 refills | Status: AC | PRN

## 2019-03-20 MED ORDER — PREDNISONE 20 MG PO TABS: 40.0000 mg | ORAL_TABLET | Freq: Every day | ORAL | 0 refills | Status: AC

## 2019-03-20 MED ORDER — FAMOTIDINE 20 MG PO TABS: 20.0000 mg | ORAL_TABLET | Freq: Two times a day (BID) | ORAL | 0 refills | Status: AC

## 2019-03-20 MED ORDER — EPINEPHRINE 0.3 MG/0.3ML IJ SOAJ
0.3000 mg | Freq: Once | INTRAMUSCULAR | Status: AC
  Administered 2019-03-20: 0.3 mg via INTRAMUSCULAR
  Filled 2019-03-20: qty 0.3

## 2019-03-20 MED FILL — BANOPHEN 25 MG CAPSULE: 25 | 25 days supply | Qty: 100 | Fill #0

## 2019-03-20 MED FILL — predniSONE 20 MG TABS: 20 | 5 days supply | Qty: 10 | Fill #0

## 2019-03-20 MED FILL — EPINEPHRINE 0.3 MG AUTO-INJ: 0.3 | 2 days supply | Qty: 2 | Fill #0

## 2019-03-20 MED FILL — FAMOTIDINE 20 MG TABS: 20 | 5 days supply | Qty: 10 | Fill #0

## 2019-03-20 NOTE — ED Notes (Signed)
Pt is sinus on monitor. States he feels a little better. VSS. Will monitor. States sob feels better.

## 2019-03-20 NOTE — ED Notes (Signed)
MD with pt  

## 2019-03-20 NOTE — ED Notes (Signed)
ED Provider at bedside. 

## 2019-03-20 NOTE — Discharge Instructions (Addendum)
Seen today for an allergic reaction.  You had evidence of anaphylaxis.  If you ever have recurrence of symptoms you will need to be evaluated immediately.  Avoid soy products.  You will be given an EpiPen.  If you develop the symptoms at home you should use your EpiPen.  Call your family doctor today.  Let them know you had a serious allergic reaction that required treatment in the emergency department.  Discussed referral to an allergist for allergy testing.  Many processed foods contain soy.  At this time, avoid any foods that are prepared with multiple ingredients.  This especially includes restaurant and fast foods as well as any prepackaged, preprepared foods.

## 2019-03-20 NOTE — ED Triage Notes (Addendum)
Pt states she drank some "soy milk" around 0150 prior to going to work and around 230 he started feeling like his eyes were swelling. Eyes swollen on exam. States she feels a little sob. 02 sats 97%. Pt with nasal congestion on exam. No distress. Lips swollen and states his skin feels itchy. Has not taken any meds pta. Pt. Also states that he started a new "keto" pill 3 weeks ago.

## 2019-03-20 NOTE — ED Provider Notes (Signed)
[place] EMERGENCY DEPARTMENT Provider Note   CSN: [id number] Arrival date & time: 03/20/19  0252     History   Chief Complaint Chief Complaint  Patient presents with  . allergic reaction    HPI Guy Seese is a 33 y.o. male.     HPI  This is a 33 year old male who presents with possible allergic reaction.  Patient reports that he drank a soy milk milkshake at approximately 0150 prior to going to work.  He reports that usually he drinks almond milk but today he drinks soy.  Upon arriving to work he began to feel congested and noted that his eyes were swelling.  He also states that he feels somewhat short of breath.  Has not noticed any rash but does report skin itching and tingling of his lips.  He did not take any medications prior to arrival.  No known soy allergy or anaphylaxis.  Denies any other possible triggers.  Past Medical History:  Diagnosis Date  . Pollen allergies   . Reflux     Patient Active Problem List   Diagnosis Date Noted  . Left ankle injury 10/04/2014    Past Surgical History:  Procedure Laterality Date  . TONSILLECTOMY    . WISDOM TOOTH EXTRACTION          Home Medications    Prior to Admission medications   Medication Sig Start Date End Date Taking? Authorizing Provider  omeprazole (PRILOSEC) 20 MG capsule Take 20 mg by mouth daily.   Yes [provider]  cetirizine (ZYRTEC) 10 MG tablet Take 10 mg by mouth daily.    [provider]  HYDROcodone-acetaminophen (NORCO/VICODIN) 5-325 MG per tablet Take 1-2 tablets by mouth every 4 (four) hours as needed. 09/26/14   Hess, Hessie Diener, PA-C  ibuprofen (ADVIL,MOTRIN) 800 MG tablet Take 1 tablet (800 mg total) by mouth 3 (three) times daily. 09/26/14   Hess, Hessie Diener, PA-C    Family History No family history on file.  Social History Social History   Tobacco Use  . Smoking status: Current Every Day Smoker    Types: Cigarettes  . Smokeless tobacco: Never Used   Substance Use Topics  . Alcohol use: Yes    Alcohol/week: 0.0 standard drinks  . Drug use: No     Allergies   Patient has no allergy information on record.   Review of Systems Review of Systems  Constitutional: Negative for fever.  HENT: Positive for congestion and facial swelling. Negative for trouble swallowing.   Eyes: Positive for itching.       Eye swelling  Respiratory: Positive for shortness of breath.   Gastrointestinal: Negative for abdominal pain, nausea and vomiting.  Skin: Negative for rash.  All other systems reviewed and are negative.    Physical Exam Updated Vital Signs BP 127/68 (BP Location: Right Arm)   Pulse 70   Temp 98.6 F (37 C) (Oral)   Resp 16   Ht 1.854 m (6\' 1" )   Wt 99.8 kg   SpO2 100%   BMI 29.03 kg/m   Physical Exam Vitals signs and nursing note reviewed.  Constitutional:      Appearance: He is well-developed. He is not ill-appearing.  HENT:     Head: Normocephalic and atraumatic.     Nose: Congestion present.     Mouth/Throat:     Mouth: Mucous membranes are moist.     Comments: Posterior oropharynx clear, no significant swelling, uvula midline Slight swelling noted of  the lips Eyes:     Pupils: Pupils are equal, round, and reactive to light.     Comments: Diffuse swelling of the bilateral eyelids  Neck:     Musculoskeletal: Neck supple.  Cardiovascular:     Rate and Rhythm: Normal rate and regular rhythm.     Heart sounds: Normal heart sounds. No murmur.  Pulmonary:     Effort: Pulmonary effort is normal. No respiratory distress.     Breath sounds: Normal breath sounds. No wheezing.  Abdominal:     General: Bowel sounds are normal.     Palpations: Abdomen is soft.     Tenderness: There is no abdominal tenderness. There is no rebound.  Musculoskeletal:        General: No swelling.  Lymphadenopathy:     Cervical: No cervical adenopathy.  Skin:    General: Skin is warm and dry.     Findings: No rash.  Neurological:      Mental Status: He is alert and oriented to person, place, and time.  Psychiatric:        Mood and Affect: Mood normal.      ED Treatments / Results  Labs (all labs ordered are listed, but only abnormal results are displayed) Labs Reviewed - No data to display  EKG None  Radiology No results found.  Procedures Procedures (including critical care time)  CRITICAL CARE Performed by: Shon Batonourtney F Horton   Total critical care time: 35 minutes  Critical care time was exclusive of separately billable procedures and treating other patients.  Critical care was necessary to treat or prevent imminent or life-threatening deterioration.  Critical care was time spent personally by me on the following activities: development of treatment plan with patient and/or surrogate as well as nursing, discussions with consultants, evaluation of patient's response to treatment, examination of patient, obtaining history from patient or surrogate, ordering and performing treatments and interventions, ordering and review of laboratory studies, ordering and review of radiographic studies, pulse oximetry and re-evaluation of patient's condition.   Medications Ordered in ED Medications  EPINEPHrine (EPI-PEN) injection 0.3 mg (0.3 mg Intramuscular Given 03/20/19 0319)  methylPREDNISolone sodium succinate (SOLU-MEDROL) 125 mg/2 mL injection 125 mg (125 mg Intravenous Given 03/20/19 0332)  diphenhydrAMINE (BENADRYL) injection 25 mg (25 mg Intravenous Given 03/20/19 0321)  famotidine (PEPCID) IVPB 20 mg premix (0 mg Intravenous Stopped 03/20/19 0359)     Initial Impression / Assessment and Plan / ED Course  I have reviewed the triage vital signs and the nursing notes.  Pertinent labs & imaging results that were available during my care of the patient were reviewed by me and considered in my medical decision making (see chart for details).  Clinical Course as of Mar 19 649  Mon Mar 20, 2019  [id number]  Patient reports he feels much better after administration of medications.  He continues to have eye swelling and congestion but overall feels that he is not short of breath and is improved.  We will continue to monitor.   [CH]  [phone] Patient continues to report improvement.  On assessment he continues to have some eye swelling but much improved congestion.  We will continue to monitor until at least 8 AM.   [CH]    Clinical Course User Index [CH] Horton, Mayer Maskerourtney F, MD       Patient presents with allergic reaction likely to soy.  He is overall nontoxic and vital signs are reassuring.  ABCs are intact.  He has multiple complaints  including congestion, swelling, shortness of breath.  No rash.  Given multiple symptoms and obvious abnormality on exam, will treat for anaphylaxis.  No evidence of shock at this time.  Patient with continued improvement during his observation..  We will continue to monitor at 8:00.  Will sign out to oncoming provider.  Patient will need an EpiPen, Benadryl, and prednisone at discharge.  Final Clinical Impressions(s) / ED Diagnoses   Final diagnoses:  Anaphylaxis, initial encounter    ED Discharge Orders    None       Shon Baton, MD 03/20/19 [phone]

## 2019-03-20 NOTE — ED Provider Notes (Signed)
Patient reports he is feeling much better.  He has not had any rebound of symptoms.  Plan was to reassess.  Dr. Dina Rich has printed prescriptions for epinephrine, prednisone, Pepcid and Benadryl. Physical Exam  BP 116/75   Pulse 62   Temp 98.6 F (37 C) (Oral)   Resp 14   Ht 6\' 1"  (1.854 m)   Wt 99.8 kg   SpO2 99%   BMI 29.03 kg/m   Physical Exam Constitutional:      Comments: Alert and in no distress.  Eyes:     Comments: Virtually no periorbital swelling.  Patient is showing me his picture and it appears to be completely resolved.  Pulmonary:     Effort: Pulmonary effort is normal.  Skin:    General: Skin is dry.  Neurological:     General: No focal deficit present.     Mental Status: He is oriented to person, place, and time.     Coordination: Coordination normal.  Psychiatric:        Mood and Affect: Mood normal.     ED Course/Procedures   Clinical Course as of Mar 19 817  Mon Mar 20, 2019  0092 Patient reports he feels much better after administration of medications.  He continues to have eye swelling and congestion but overall feels that he is not short of breath and is improved.  We will continue to monitor.   [CH]  [phone] Patient continues to report improvement.  On assessment he continues to have some eye swelling but much improved congestion.  We will continue to monitor until at least 8 AM.   [CH]    Clinical Course User Index [CH] Horton, Barbette Hair, MD    Procedures  MDM  Patient is counseled to follow-up with his PCP by phone today.  He is counseled to discuss referral to an allergist for allergy testing.  He is counseled to avoid all processed foods as they may contain soy products.  He is counseled on the use of an EpiPen.  Patient voices understanding is discharged in good condition with no subjective feelings of difficulty swallowing, breathing or objective swelling.      Charlesetta Shanks, MD 03/20/19 [phone]

## 2019-06-14 ENCOUNTER — Ambulatory Visit: Payer: Federal, State, Local not specified - PPO | Attending: Internal Medicine

## 2019-06-14 DIAGNOSIS — Z20822 Contact with and (suspected) exposure to covid-19: Secondary | ICD-10-CM

## 2019-06-17 LAB — NOVEL CORONAVIRUS, NAA: SARS-CoV-2, NAA: DETECTED — AB

## 2020-12-21 ENCOUNTER — Emergency Department (HOSPITAL_BASED_OUTPATIENT_CLINIC_OR_DEPARTMENT_OTHER)
Admission: EM | Admit: 2020-12-21 | Discharge: 2020-12-21 | Disposition: A | Discharge status: Home / Self Care | Payer: Federal, State, Local not specified - PPO | Attending: Emergency Medicine | Admitting: Emergency Medicine

## 2020-12-21 ENCOUNTER — Other Ambulatory Visit: Payer: Self-pay

## 2020-12-21 ENCOUNTER — Encounter (HOSPITAL_BASED_OUTPATIENT_CLINIC_OR_DEPARTMENT_OTHER): Payer: Self-pay | Admitting: Emergency Medicine

## 2020-12-21 DIAGNOSIS — M533 Sacrococcygeal disorders, not elsewhere classified: Secondary | ICD-10-CM | POA: Diagnosis present

## 2020-12-21 DIAGNOSIS — F17210 Nicotine dependence, cigarettes, uncomplicated: Secondary | ICD-10-CM | POA: Diagnosis not present

## 2020-12-21 MED ORDER — NAPROXEN 500 MG PO TABS: 500.0000 mg | ORAL_TABLET | Freq: Two times a day (BID) | ORAL | 0 refills | Status: AC

## 2020-12-21 MED ORDER — LIDOCAINE 5 % EX PTCH: 1.0000 | MEDICATED_PATCH | CUTANEOUS | 0 refills | Status: AC

## 2020-12-21 MED ORDER — NAPROXEN 250 MG PO TABS
500.0000 mg | ORAL_TABLET | Freq: Once | ORAL | Status: AC
  Administered 2020-12-21: 500 mg via ORAL
  Filled 2020-12-21: qty 2

## 2020-12-21 NOTE — ED Triage Notes (Signed)
Pt c/o pain in tail bone area. Think possible abscess starting.

## 2020-12-21 NOTE — ED Provider Notes (Signed)
[facility] Emergency Department Provider Note MRN:  [id number]  Arrival date & time: 12/21/20     Chief Complaint   Tailbone Pain   History of Present Illness   Tommy Bradley is a 35 y.o. year-old male with no pertinent past medical presenting to the ED with chief complaint of tailbone pain.  Pain in the tailbone for the past 2 or 3 days, progressively worsening, constant, worse with sitting or palpation.  Denies any fever, no other complaints, no trauma.  Review of Systems  A problem-focused ROS was performed. Positive for tailbone.  Patient denies fever. Patient's Health History    Past Medical History:  Diagnosis Date   Pollen allergies    Reflux     Past Surgical History:  Procedure Laterality Date   TONSILLECTOMY     WISDOM TOOTH EXTRACTION      History reviewed. No pertinent family history.  Social History   Socioeconomic History   Marital status: Married    Spouse name: Not on file   Number of children: Not on file   Years of education: Not on file   Highest education level: Not on file  Occupational History   Not on file  Tobacco Use   Smoking status: Every Day    Types: Cigarettes   Smokeless tobacco: Never  Substance and Sexual Activity   Alcohol use: Yes    Alcohol/week: 0.0 standard drinks   Drug use: No   Sexual activity: Not on file  Other Topics Concern   Not on file  Social History Narrative   Not on file   Social Determinants of Health   Financial Resource Strain: Not on file  Food Insecurity: Not on file  Transportation Needs: Not on file  Physical Activity: Not on file  Stress: Not on file  Social Connections: Not on file  Intimate Partner Violence: Not on file     Physical Exam   Vitals:   12/21/20 0311  BP: 132/90  Pulse: (!) 46  Resp: 18  Temp: 97.8 F (36.6 C)  SpO2: 100%    CONSTITUTIONAL: Well-appearing, NAD NEURO:  Alert and oriented x 3, no focal deficits EYES:  eyes equal and  reactive ENT/NECK:  no LAD, no JVD CARDIO: Regular rate, well-perfused, normal S1 and S2 PULM:  CTAB no wheezing or rhonchi GI/GU:  normal bowel sounds, non-distended, non-tender MSK/SPINE:  No gross deformities, no edema; tender to palpation to the tailbone SKIN:  no rash, atraumatic PSYCH:  Appropriate speech and behavior  *Additional and/or pertinent findings included in MDM below  Diagnostic and Interventional Summary    EKG Interpretation  Date/Time:    Ventricular Rate:    PR Interval:    QRS Duration:   QT Interval:    QTC Calculation:   R Axis:     Text Interpretation:         Labs Reviewed - No data to display  No orders to display    Medications  naproxen (NAPROSYN) tablet 500 mg (500 mg Oral Given 12/21/20 0358)     Procedures  /  Critical Care Procedures  ED Course and Medical Decision Making  I have reviewed the triage vital signs, the nursing notes, and pertinent available records from the EMR.  Listed above are laboratory and imaging tests that I personally ordered, reviewed, and interpreted and then considered in my medical decision making (see below for details).  History and exam consistent with coccydynia, exam is without any evidence of infection  or abscess to the gluteal region.  Normal vital signs, no trauma, no indication for imaging, appropriate for discharge with anti-inflammatories.       Elmer Sow. Pilar Plate, MD [facility] Emergency Medicine [facility] [email or web address]  Final Clinical Impressions(s) / ED Diagnoses     ICD-10-CM   1. Coccyx pain  M53.3       ED Discharge Orders          Ordered    naproxen (NAPROSYN) 500 MG tablet  2 times daily        12/21/20 0405    lidocaine (LIDODERM) 5 %  Every 24 hours        12/21/20 0405             Discharge Instructions Discussed with and Provided to Patient:    Discharge Instructions      You were evaluated in the Emergency Department and after careful  evaluation, we did not find any emergent condition requiring admission or further testing in the hospital.  Your exam/testing today is overall reassuring.  Symptoms seem to be due to inflammation of the tailbone.  Please take the Naprosyn anti-inflammatories as directed, can also use the numbing patches provided.  Symptoms should go away within the next 1 to 2 weeks.  Please return to the Emergency Department if you experience any worsening of your condition.   Thank you for allowing [place] to be a part of your care.       Sabas Sous, MD 12/21/20 [phone]

## 2020-12-21 NOTE — Discharge Instructions (Addendum)
You were evaluated in the Emergency Department and after careful evaluation, we did not find any emergent condition requiring admission or further testing in the hospital.  Your exam/testing today is overall reassuring.  Symptoms seem to be due to inflammation of the tailbone.  Please take the Naprosyn anti-inflammatories as directed, can also use the numbing patches provided.  Symptoms should go away within the next 1 to 2 weeks.  Please return to the Emergency Department if you experience any worsening of your condition.   Thank you for allowing [place] to be a part of your care.
